# Patient Record
Sex: Male | Born: 1985 | Race: White | Hispanic: No | Marital: Single | Smoking: Former smoker
Health system: Southern US, Community
[De-identification: ages and names within clinical notes are randomized; demographics above are authoritative.]

## PROBLEM LIST (undated history)

## (undated) DIAGNOSIS — Z789 Other specified health status: Secondary | ICD-10-CM

---
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
## 2020-07-01 ENCOUNTER — Inpatient Hospital Stay (HOSPITAL_COMMUNITY): Payer: BC Managed Care – PPO

## 2020-07-01 ENCOUNTER — Encounter (HOSPITAL_COMMUNITY): Payer: Self-pay | Admitting: Family Medicine

## 2020-07-01 ENCOUNTER — Other Ambulatory Visit: Payer: Self-pay

## 2020-07-01 ENCOUNTER — Inpatient Hospital Stay (HOSPITAL_COMMUNITY)
Admission: EM | Admit: 2020-07-01 | Discharge: 2020-07-05 | DRG: 871 | Disposition: A | Discharge status: Home / Self Care | Payer: BC Managed Care – PPO | Attending: Internal Medicine | Admitting: Internal Medicine

## 2020-07-01 ENCOUNTER — Emergency Department (HOSPITAL_COMMUNITY): Payer: BC Managed Care – PPO

## 2020-07-01 DIAGNOSIS — J129 Viral pneumonia, unspecified: Secondary | ICD-10-CM | POA: Diagnosis not present

## 2020-07-01 DIAGNOSIS — J9601 Acute respiratory failure with hypoxia: Secondary | ICD-10-CM | POA: Diagnosis present

## 2020-07-01 DIAGNOSIS — R809 Proteinuria, unspecified: Secondary | ICD-10-CM | POA: Diagnosis present

## 2020-07-01 DIAGNOSIS — R071 Chest pain on breathing: Secondary | ICD-10-CM

## 2020-07-01 DIAGNOSIS — R000 Tachycardia, unspecified: Secondary | ICD-10-CM | POA: Diagnosis present

## 2020-07-01 DIAGNOSIS — Z87891 Personal history of nicotine dependence: Secondary | ICD-10-CM | POA: Diagnosis not present

## 2020-07-01 DIAGNOSIS — J1282 Pneumonia due to coronavirus disease 2019: Secondary | ICD-10-CM | POA: Diagnosis present

## 2020-07-01 DIAGNOSIS — R079 Chest pain, unspecified: Secondary | ICD-10-CM | POA: Diagnosis present

## 2020-07-01 DIAGNOSIS — A4189 Other specified sepsis: Secondary | ICD-10-CM | POA: Diagnosis present

## 2020-07-01 DIAGNOSIS — J189 Pneumonia, unspecified organism: Secondary | ICD-10-CM | POA: Diagnosis present

## 2020-07-01 DIAGNOSIS — A419 Sepsis, unspecified organism: Secondary | ICD-10-CM | POA: Diagnosis present

## 2020-07-01 DIAGNOSIS — Z9981 Dependence on supplemental oxygen: Secondary | ICD-10-CM | POA: Diagnosis not present

## 2020-07-01 DIAGNOSIS — U071 COVID-19: Secondary | ICD-10-CM | POA: Diagnosis present

## 2020-07-01 DIAGNOSIS — R55 Syncope and collapse: Secondary | ICD-10-CM | POA: Diagnosis present

## 2020-07-01 DIAGNOSIS — R824 Acetonuria: Secondary | ICD-10-CM | POA: Diagnosis present

## 2020-07-01 DIAGNOSIS — R0602 Shortness of breath: Secondary | ICD-10-CM

## 2020-07-01 HISTORY — DX: Other specified health status: Z78.9

## 2020-07-01 LAB — COMPREHENSIVE METABOLIC PANEL
ALT: 41 U/L (ref 0–44)
AST: 36 U/L (ref 15–41)
Albumin: 3.7 g/dL (ref 3.5–5.0)
Alkaline Phosphatase: 68 U/L (ref 38–126)
Anion gap: 13 (ref 5–15)
BUN: 9 mg/dL (ref 6–20)
CO2: 25 mmol/L (ref 22–32)
Calcium: 8.8 mg/dL — ABNORMAL LOW (ref 8.9–10.3)
Chloride: 98 mmol/L (ref 98–111)
Creatinine, Ser: 1.17 mg/dL (ref 0.61–1.24)
GFR calc Af Amer: >60 mL/min (ref >60)
GFR calc non Af Amer: >60 mL/min (ref >60)
Glucose, Bld: 112 mg/dL — ABNORMAL HIGH (ref 70–99)
Potassium: 3.9 mmol/L (ref 3.5–5.1)
Sodium: 136 mmol/L (ref 135–145)
Total Bilirubin: 1.3 mg/dL — ABNORMAL HIGH (ref 0.3–1.2)
Total Protein: 7 g/dL (ref 6.5–8.1)

## 2020-07-01 LAB — CBC WITH DIFFERENTIAL/PLATELET
Abs Immature Granulocytes: 0.03 10*3/uL (ref 0.00–0.07)
Basophils Absolute: 0 10*3/uL (ref 0.0–0.1)
Basophils Relative: 0 %
Eosinophils Absolute: 0 10*3/uL (ref 0.0–0.5)
Eosinophils Relative: 0 %
HCT: 43.6 % (ref 39.0–52.0)
Hemoglobin: 14.5 g/dL (ref 13.0–17.0)
Immature Granulocytes: 1 %
Lymphocytes Relative: 23 %
Lymphs Abs: 1.1 10*3/uL (ref 0.7–4.0)
MCH: 29.1 pg (ref 26.0–34.0)
MCHC: 33.3 g/dL (ref 30.0–36.0)
MCV: 87.6 fL (ref 80.0–100.0)
Monocytes Absolute: 0.5 10*3/uL (ref 0.1–1.0)
Monocytes Relative: 10 %
Neutro Abs: 3.2 10*3/uL (ref 1.7–7.7)
Neutrophils Relative %: 66 %
Platelets: 190 10*3/uL (ref 150–400)
RBC: 4.98 MIL/uL (ref 4.22–5.81)
RDW: 11.4 % — ABNORMAL LOW (ref 11.5–15.5)
WBC: 4.8 10*3/uL (ref 4.0–10.5)
nRBC: 0 % (ref 0.0–0.2)

## 2020-07-01 LAB — URINALYSIS, ROUTINE W REFLEX MICROSCOPIC
Bacteria, UA: NONE SEEN
Bilirubin Urine: NEGATIVE
Glucose, UA: NEGATIVE mg/dL
Hgb urine dipstick: NEGATIVE
Ketones, ur: 80 mg/dL — AB
Leukocytes,Ua: NEGATIVE
Nitrite: NEGATIVE
Protein, ur: 30 mg/dL — AB
Specific Gravity, Urine: 1.025 (ref 1.005–1.030)
pH: 5 (ref 5.0–8.0)

## 2020-07-01 LAB — TROPONIN I (HIGH SENSITIVITY)
Troponin I (High Sensitivity): 4 ng/L (ref <18)
Troponin I (High Sensitivity): 5 ng/L (ref <18)

## 2020-07-01 LAB — BRAIN NATRIURETIC PEPTIDE: B Natriuretic Peptide: 14.3 pg/mL (ref 0.0–100.0)

## 2020-07-01 LAB — PROCALCITONIN: Procalcitonin: <0.1 ng/mL

## 2020-07-01 LAB — LACTIC ACID, PLASMA
Lactic Acid, Venous: 1.4 mmol/L (ref 0.5–1.9)
Lactic Acid, Venous: 1.4 mmol/L (ref 0.5–1.9)

## 2020-07-01 LAB — LACTATE DEHYDROGENASE: LDH: 273 U/L — ABNORMAL HIGH (ref 98–192)

## 2020-07-01 LAB — D-DIMER, QUANTITATIVE: D-Dimer, Quant: 0.63 ug/mL-FEU — ABNORMAL HIGH (ref 0.00–0.50)

## 2020-07-01 LAB — ABO/RH: ABO/RH(D): O POS

## 2020-07-01 LAB — FIBRINOGEN: Fibrinogen: 554 mg/dL — ABNORMAL HIGH (ref 210–475)

## 2020-07-01 LAB — PROTIME-INR
INR: 1 (ref 0.8–1.2)
Prothrombin Time: 12.4 seconds (ref 11.4–15.2)

## 2020-07-01 LAB — C-REACTIVE PROTEIN: CRP: 5.7 mg/dL — ABNORMAL HIGH (ref <1.0)

## 2020-07-01 LAB — SARS CORONAVIRUS 2 BY RT PCR (HOSPITAL ORDER, PERFORMED IN ~~LOC~~ HOSPITAL LAB): SARS Coronavirus 2: POSITIVE — AB

## 2020-07-01 LAB — HIV ANTIBODY (ROUTINE TESTING W REFLEX): HIV Screen 4th Generation wRfx: NONREACTIVE

## 2020-07-01 MED ORDER — DEXAMETHASONE SODIUM PHOSPHATE 10 MG/ML IJ SOLN
6.0000 mg | INTRAMUSCULAR | Status: DC
  Administered 2020-07-01: 6 mg via INTRAVENOUS
  Filled 2020-07-01: qty 1

## 2020-07-01 MED ORDER — GUAIFENESIN-DM 100-10 MG/5ML PO SYRP: 10.0000 mL | ORAL_SOLUTION | ORAL | Status: DC | PRN

## 2020-07-01 MED ORDER — SODIUM CHLORIDE 0.9 % IV SOLN: 100.0000 mg | Freq: Every day | INTRAVENOUS | Status: DC

## 2020-07-01 MED ORDER — SODIUM CHLORIDE 0.9 % IV SOLN
200.0000 mg | Freq: Once | INTRAVENOUS | Status: AC
  Administered 2020-07-02: 200 mg via INTRAVENOUS
  Filled 2020-07-01: qty 40

## 2020-07-01 MED ORDER — LACTATED RINGERS IV BOLUS
1000.0000 mL | Freq: Once | INTRAVENOUS | Status: AC
  Administered 2020-07-01: 1000 mL via INTRAVENOUS

## 2020-07-01 MED ORDER — ACETAMINOPHEN 325 MG PO TABS
650.0000 mg | ORAL_TABLET | Freq: Once | ORAL | Status: AC | PRN
  Administered 2020-07-01: 650 mg via ORAL
  Filled 2020-07-01: qty 2

## 2020-07-01 MED ORDER — IOHEXOL 350 MG/ML SOLN
75.0000 mL | Freq: Once | INTRAVENOUS | Status: AC | PRN
  Administered 2020-07-01: 75 mL via INTRAVENOUS

## 2020-07-01 NOTE — ED Triage Notes (Signed)
Pt reports feeling light headed while he was showering today. Also endorses difficulty breathing and chest pain. Dx with covid last week.

## 2020-07-01 NOTE — ED Notes (Signed)
Date and time results received: 07/01/20 2305  (use smartphrase ".now" to insert current time)  Test: SARS CORONAVIRUS Critical Value: positive   Name of Provider Notified: provider aware   Orders Received? Or Actions Taken?:

## 2020-07-01 NOTE — ED Notes (Signed)
Pt O2sat was 92% on room air upon arriving to room. This NT placed pt on 2L oxygen via nasal cannula

## 2020-07-01 NOTE — H&P (Signed)
History and Physical    Marvin Brady EHO:[id number] DOB: 09-04-1986 DOA: 07/01/2020  PCP: Patient, No Pcp Per   Patient coming from: Home   Chief Complaint: SOB, fevers, cough, chest pain, lightheadedness   HPI: Marvin Brady is a 34 y.o. male he denies any significant past medical history now presents to the emergency department with fevers, cough, shortness of breath, lightheadedness, and chest pain.  Patient reports that he been in his usual state of health until 8 days ago when he developed fevers, sinus congestion, general malaise, and cough.  He notes that there have been multiple sick contacts reported to have Covid.  He has had progressive shortness of breath, cough, ongoing fevers, and no pleuritic chest pain.  He became lightheaded today without any loss of consciousness.  He reports testing positive for COVID-19 during this illness.  ED Course: Upon arrival to the ED, patient is found to be febrile to 39.4 C, saturating low 90s on room air while at rest, slightly tachypneic, tachycardic to 120, and with stable blood pressure.  Chemistry panel is notable for slight elevation in bilirubin.  CBC is unremarkable.  Lactic acid is reassuringly normal and troponin is also normal.  Urinalysis with ketonuria and proteinuria.  Chest x-ray with bibasilar hazy opacities consistent with viral pneumonia.  EKG features sinus tachycardia with rate 124.  Blood cultures were collected and the patient was given a liter of lactated Ringer's and acetaminophen in the ED.  He was started on supplemental oxygen.  Review of Systems:  All other systems reviewed and apart from HPI, are negative.  Past Medical History:  Diagnosis Date  . Medical history non-contributory     History reviewed. No pertinent surgical history.  Social History:   reports that he has quit smoking. He has never used smokeless tobacco. He reports previous alcohol use. No history on file for drug use.  No Known  Allergies  Family History  Problem Relation Age of Onset  . Pneumonia Other      Prior to Admission medications   Not on File    Physical Exam: Vitals:   07/01/20 1503 07/01/20 1734 07/01/20 1843 07/01/20 1952  BP: 113/78 102/72 121/82 121/77  Pulse: 100 (!) 108 94 (!) 117  Resp: 17 14 18  (!) 28  Temp: (!) 102.9 F (39.4 C)  99.6 F (37.6 C)   TempSrc: Oral  Oral   SpO2: 93% 92% 94% 92%    Constitutional: NAD, calm  Eyes: PERTLA, lids and conjunctivae normal ENMT: Mucous membranes are moist. Posterior pharynx clear of any exudate or lesions.   Neck: normal, supple, no masses, no thyromegaly Respiratory: Mild tachypnea, frequent cough. No pallor or cyanosis.  Cardiovascular: S1 & S2 heard, regular rate and rhythm. No extremity edema.   Abdomen: No distension, no tenderness, soft. Bowel sounds active.  Musculoskeletal: no clubbing / cyanosis. No joint deformity upper and lower extremities.   Skin: no significant rashes, lesions, ulcers. Warm, dry, well-perfused. Neurologic: CN 2-12 grossly intact. Sensation intact. Moving all extremities.  Psychiatric: Alert and oriented to person, place, and situation. Pleasant and cooperative.    Labs and Imaging on Admission: I have personally reviewed following labs and imaging studies  CBC: Recent Labs  Lab 07/01/20 1306  WBC 4.8  NEUTROABS 3.2  HGB 14.5  HCT 43.6  MCV 87.6  PLT 190   Basic Metabolic Panel: Recent Labs  Lab 07/01/20 1306  NA 136  K 3.9  CL 98  CO2 25  GLUCOSE 112*  BUN 9  CREATININE 1.17  CALCIUM 8.8*   GFR: CrCl cannot be calculated (Unknown ideal weight.). Liver Function Tests: Recent Labs  Lab 07/01/20 1306  AST 36  ALT 41  ALKPHOS 68  BILITOT 1.3*  PROT 7.0  ALBUMIN 3.7   No results for input(s): LIPASE, AMYLASE in the last 168 hours. No results for input(s): AMMONIA in the last 168 hours. Coagulation Profile: Recent Labs  Lab 07/01/20 1306  INR 1.0   Cardiac Enzymes: No  results for input(s): CKTOTAL, CKMB, CKMBINDEX, TROPONINI in the last 168 hours. BNP (last 3 results) No results for input(s): PROBNP in the last 8760 hours. HbA1C: No results for input(s): HGBA1C in the last 72 hours. CBG: No results for input(s): GLUCAP in the last 168 hours. Lipid Profile: No results for input(s): CHOL, HDL, LDLCALC, TRIG, CHOLHDL, LDLDIRECT in the last 72 hours. Thyroid Function Tests: No results for input(s): TSH, T4TOTAL, FREET4, T3FREE, THYROIDAB in the last 72 hours. Anemia Panel: No results for input(s): VITAMINB12, FOLATE, FERRITIN, TIBC, IRON, RETICCTPCT in the last 72 hours. Urine analysis:    Component Value Date/Time   COLORURINE AMBER (A) 07/01/2020 1943   APPEARANCEUR HAZY (A) 07/01/2020 1943   LABSPEC 1.025 07/01/2020 1943   PHURINE 5.0 07/01/2020 1943   GLUCOSEU NEGATIVE 07/01/2020 1943   HGBUR NEGATIVE 07/01/2020 1943   BILIRUBINUR NEGATIVE 07/01/2020 1943   KETONESUR 80 (A) 07/01/2020 1943   PROTEINUR 30 (A) 07/01/2020 1943   NITRITE NEGATIVE 07/01/2020 1943   LEUKOCYTESUR NEGATIVE 07/01/2020 1943   Sepsis Labs: @LABRCNTIP (procalcitonin:4,lacticidven:4) )No results found for this or any previous visit (from the past 240 hour(s)).   Radiological Exams on Admission: DG Chest Portable 1 View  Result Date: 07/01/2020 CLINICAL DATA:  Short of breath EXAM: PORTABLE CHEST 1 VIEW COMPARISON:  March 09, 2006 FINDINGS: The cardiomediastinal silhouette is normal in contour. No pleural effusion. No pneumothorax. Bibasilar peripheral predominant hazy opacities. Visualized abdomen is unremarkable. No acute osseous abnormality noted. IMPRESSION: Bibasilar peripheral predominant hazy opacities are suspicious for COVID-19 pneumonia. Electronically Signed   By: March 11, 2006 MD   On: 07/01/2020 13:14    EKG: Independently reviewed. Sinus tachycardia (rate 124)   Assessment/Plan   1. Sepsis secondary to pneumonia  - Presents with 8 days of fevers,  progressive SOB and cough, and now pleuritic chest pain and lightheadedness, reports being positive for COVID-19 but unable to confirm in ED and COVID pcr pending  - He is found to be febrile and tachycardic with viral pneumonia type findings on CXR  - Blood cultures were collected in ED and he was treated with IVF hydration and supplemental O2  - Check COVID pcr, start Decadron now and remdesivir once COVID confirmed, check procalcitonin and consider antibiotics if elevated, continue supplemental O2 as needed, prone as tolerated, trend inflammatory markers, follow cultures and clninical course    2. Chest pain  - Patient reports pleuritic chest pain in setting of likely COVID pneumonia  - Troponin is normal and ACS highly unlikely   - Check CTA chest, continue pneumonia treatment as above    DVT prophylaxis: Lovenox  Code Status: Full  Family Communication: Discussed with patient  Disposition Plan:  Patient is from: Home  Anticipated d/c is to: Home  Anticipated d/c date is: 07/04/20 Patient currently: Dyspneic at rest, pending CTA chest  Consults called: None Admission status: Inpatient     07/06/20, MD [facility]  07/01/2020, 9:06 PM

## 2020-07-01 NOTE — ED Notes (Signed)
Pt given urinal in room. This NT told him to give urinal as soon as possible for UA

## 2020-07-01 NOTE — ED Provider Notes (Signed)
[facility] EMERGENCY DEPARTMENT Provider Note   CSN: [id number] Arrival date & time: 07/01/20  1238     History Chief Complaint  Patient presents with  . Near Syncope    Marvin Brady is a 34 y.o. male who presents with SOB and pre-syncopal episode this am. Pt diagnosed with COVID last week, symptoms started on 7/18 (8 days prior). Pt endorses SOB w some chest discomfort with deep inspiration that "gives him the sensation he needs to cough". Denies chest pain with laying flat, no hx of DVT/PE. Pt states girlfriends family was sick with COVID. Endorses cough, SOB, lost of taste/smell, nausea, emesis and decreased PO. Pt states this am he was in the shower when he began to feel lightheaded with standing associated with some blackness of vision, nausea and sensation he was going to pass out. Pre-syncopal symptoms now resolved with laying down in room.   HPI     Past Medical History:  Diagnosis Date  . Medical history non-contributory     Patient Active Problem List   Diagnosis Date Noted  . Viral pneumonia 07/01/2020  . Chest pain 07/01/2020  . Sepsis due to pneumonia (HCC) 07/01/2020    History reviewed. No pertinent surgical history.     Family History  Problem Relation Age of Onset  . Pneumonia Other     Social History   Tobacco Use  . Smoking status: Former Games developer  . Smokeless tobacco: Never Used  Substance Use Topics  . Alcohol use: Not Currently  . Drug use: Not on file    Home Medications Prior to Admission medications   Not on File    Allergies    Patient has no known allergies.  Review of Systems   Review of Systems  Constitutional: Positive for activity change, appetite change, chills, fatigue and fever.  HENT: Negative for congestion and sore throat.   Eyes: Positive for visual disturbance. Negative for redness.       Endorses changes to vision during episode of pre-syncope that has now resolved.    Respiratory: Positive  for shortness of breath. Negative for wheezing.   Cardiovascular: Negative for chest pain and leg swelling.  Gastrointestinal: Positive for nausea and vomiting. Negative for abdominal pain.  Genitourinary: Positive for decreased urine volume. Negative for difficulty urinating and dysuria.  Skin: Negative for rash and wound.  Neurological: Negative for syncope and facial asymmetry.  Psychiatric/Behavioral: Negative.     Physical Exam Updated Vital Signs BP 118/71   Pulse 93   Temp 99.6 F (37.6 C) (Oral)   Resp 18   SpO2 96%   Vitals:   07/01/20 1843 07/01/20 1952 07/01/20 2100 07/01/20 2200  BP: 121/82 121/77 127/75 118/71  Pulse: 94 (!) 117 93 93  Resp: 18 20 17 18   Temp: 99.6 F (37.6 C)     TempSrc: Oral     SpO2: 94% 92% 97% 96%    Physical Exam Vitals and nursing note reviewed.  Constitutional:      General: He is not in acute distress.    Appearance: Normal appearance. He is not toxic-appearing or diaphoretic.  HENT:     Head: Normocephalic and atraumatic.     Nose: Nose normal.     Mouth/Throat:     Mouth: Mucous membranes are dry.  Eyes:     Extraocular Movements: Extraocular movements intact.     Conjunctiva/sclera: Conjunctivae normal.  Cardiovascular:     Rate and Rhythm: Normal rate and regular rhythm.  Heart sounds: Normal heart sounds. No murmur heard.   Pulmonary:     Effort: Respiratory distress present.     Breath sounds: Normal breath sounds. No wheezing, rhonchi or rales.  Abdominal:     General: Abdomen is flat.     Palpations: Abdomen is soft.     Tenderness: There is no abdominal tenderness.  Musculoskeletal:     Right lower leg: No edema.     Left lower leg: No edema.  Skin:    General: Skin is warm.     Findings: No rash.  Neurological:     General: No focal deficit present.     Mental Status: He is alert and oriented to person, place, and time.  Psychiatric:        Mood and Affect: Mood normal.        Behavior: Behavior  normal.        Thought Content: Thought content normal.        Judgment: Judgment normal.     ED Results / Procedures / Treatments   Labs (all labs ordered are listed, but only abnormal results are displayed) Labs Reviewed  SARS CORONAVIRUS 2 BY RT PCR (HOSPITAL ORDER, PERFORMED IN [facility]) - Abnormal; Notable for the following components:      Result Value   SARS Coronavirus 2 POSITIVE (*)    All other components within normal limits  COMPREHENSIVE METABOLIC PANEL - Abnormal; Notable for the following components:   Glucose, Bld 112 (*)    Calcium 8.8 (*)    Total Bilirubin 1.3 (*)    All other components within normal limits  CBC WITH DIFFERENTIAL/PLATELET - Abnormal; Notable for the following components:   RDW 11.4 (*)    All other components within normal limits  URINALYSIS, ROUTINE W REFLEX MICROSCOPIC - Abnormal; Notable for the following components:   Color, Urine AMBER (*)    APPearance HAZY (*)    Ketones, ur 80 (*)    Protein, ur 30 (*)    All other components within normal limits  C-REACTIVE PROTEIN - Abnormal; Notable for the following components:   CRP 5.7 (*)    All other components within normal limits  D-DIMER, QUANTITATIVE (NOT AT [facility]) - Abnormal; Notable for the following components:   D-Dimer, Quant 0.63 (*)    All other components within normal limits  LACTATE DEHYDROGENASE - Abnormal; Notable for the following components:   LDH 273 (*)    All other components within normal limits  FIBRINOGEN - Abnormal; Notable for the following components:   Fibrinogen 554 (*)    All other components within normal limits  CULTURE, BLOOD (ROUTINE X 2)  CULTURE, BLOOD (ROUTINE X 2)  LACTIC ACID, PLASMA  LACTIC ACID, PLASMA  PROTIME-INR  HIV ANTIBODY (ROUTINE TESTING W REFLEX)  BRAIN NATRIURETIC PEPTIDE  PROCALCITONIN  HEPATITIS B SURFACE ANTIGEN  ABO/RH  TROPONIN I (HIGH SENSITIVITY)  TROPONIN I (HIGH SENSITIVITY)    EKG EKG  Interpretation  Date/Time:  Monday July 01 2020 12:56:51 EDT Ventricular Rate:  124 PR Interval:  148 QRS Duration: 94 QT Interval:  308 QTC Calculation: 442 R Axis:   107 Text Interpretation: Sinus tachycardia Rightward axis Cannot rule out Inferior infarct , age undetermined Abnormal ECG No old tracing to compare Confirmed by Melene PlanFloyd, Dan [phone]([id number]) on 07/01/2020 6:53:16 PM   Radiology CT ANGIO CHEST PE W OR WO CONTRAST  Result Date: 07/01/2020 CLINICAL DATA:  Chest pain and shortness of breath COVID EXAM:  CT ANGIOGRAPHY CHEST WITH CONTRAST TECHNIQUE: Multidetector CT imaging of the chest was performed using the standard protocol during bolus administration of intravenous contrast. Multiplanar CT image reconstructions and MIPs were obtained to evaluate the vascular anatomy. CONTRAST:  66mL OMNIPAQUE IOHEXOL 350 MG/ML SOLN COMPARISON:  None. FINDINGS: Cardiovascular: There is slightly suboptimal opacification of the main pulmonary artery. No central or segmental pulmonary embolism is seen. Limited evaluation of the subsegmental arterial branches. The heart is normal in size. No pericardial effusion or thickening. No evidence right heart strain. There is normal three-vessel brachiocephalic anatomy without proximal stenosis. The thoracic aorta is normal in appearance. Mediastinum/Nodes: No hilar, mediastinal, or axillary adenopathy. Thyroid gland, trachea, and esophagus demonstrate no significant findings. Lungs/Pleura: Patchy rounded areas consolidation are seen at the posterior bilateral lung bases. There air bronchogram seen at the right lung base. No effusion is seen. Upper Abdomen: No acute abnormalities present in the visualized portions of the upper abdomen. Musculoskeletal: No chest wall abnormality. No acute or significant osseous findings. Review of the MIP images confirms the above findings. IMPRESSION: Slightly suboptimal opacification of the main pulmonary artery, no central or segmental  pulmonary embolism. Multifocal airspace opacities in both lower lungs, consistent with multifocal pneumonia. Electronically Signed   By: Jonna Clark M.D.   On: 07/01/2020 23:06   DG Chest Portable 1 View  Result Date: 07/01/2020 CLINICAL DATA:  Short of breath EXAM: PORTABLE CHEST 1 VIEW COMPARISON:  March 09, 2006 FINDINGS: The cardiomediastinal silhouette is normal in contour. No pleural effusion. No pneumothorax. Bibasilar peripheral predominant hazy opacities. Visualized abdomen is unremarkable. No acute osseous abnormality noted. IMPRESSION: Bibasilar peripheral predominant hazy opacities are suspicious for COVID-19 pneumonia. Electronically Signed   By: Meda Klinefelter MD   On: 07/01/2020 13:14    Procedures Procedures (including critical care time)  Medications Ordered in ED Medications  dexamethasone (DECADRON) injection 6 mg (6 mg Intravenous Given 07/01/20 2128)  guaiFENesin-dextromethorphan (ROBITUSSIN DM) 100-10 MG/5ML syrup 10 mL (has no administration in time range)  remdesivir 200 mg in sodium chloride 0.9% 250 mL IVPB (has no administration in time range)    Followed by  remdesivir 100 mg in sodium chloride 0.9 % 100 mL IVPB (has no administration in time range)  acetaminophen (TYLENOL) tablet 650 mg (650 mg Oral Given 07/01/20 1649)  lactated ringers bolus 1,000 mL (0 mLs Intravenous Stopped 07/01/20 2302)  iohexol (OMNIPAQUE) 350 MG/ML injection 75 mL (75 mLs Intravenous Contrast Given 07/01/20 2300)    ED Course  I have reviewed the triage vital signs and the nursing notes.  Pertinent labs & imaging results that were available during my care of the patient were reviewed by me and considered in my medical decision making (see chart for details).    MDM Rules/Calculators/A&P                          Pt is an otherwise healthy 33 yo M who presents with increased SOB and pre-syncopal episode this am. Pt diagnosed with COVID last week, on ~ day 8 of symptoms. Pt endorses  SOB wo Chest pain, Fever, Nausea/emesis, lost of taste/smell. Pre-syncopal episode occurred in shower this am wo LOC, associated with vision changes, nausea. Pt endorses decreased PO over past few days. On arrival pt febrile to 102.7 and tachycardic to HR 120s and SpO2 90% rm air.  Patient given Tylenol with appropriate response, and improvement in temperature and tachycardia.  On exam patient with dry  mm. Lungs clear to auscultation. Abdomen soft, non-tender, non-distended. No LE edema b/l. Pt placed on 2L [place] with improvement O2 to 96%. CXR significant for bibasilar opacities consistent with COVID19 infxn. Labs fully reviewed. No lactic acidosis. On CMP Cr 1.17 however prior labs not available for comparison but could be concerning for AKI in setting of decreased PO. 1L LR given. WBC wnl. Initial troponin 4. On ambulation pt w O2 drop to 89% on rm air. Due to new O2 requirement and pre-syncopal event in the setting of decreased PO will admit to hospitalist service for continued management. Patient admitted.  Final Clinical Impression(s) / ED Diagnoses Final diagnoses:  Near syncope  SOB (shortness of breath)  COVID-19    Rx / DC Orders ED Discharge Orders    None       Golden Pop, MD 07/01/20 2345    Melene Plan, DO 07/02/20 1335

## 2020-07-02 DIAGNOSIS — J129 Viral pneumonia, unspecified: Secondary | ICD-10-CM

## 2020-07-02 DIAGNOSIS — J189 Pneumonia, unspecified organism: Secondary | ICD-10-CM

## 2020-07-02 DIAGNOSIS — A419 Sepsis, unspecified organism: Secondary | ICD-10-CM

## 2020-07-02 LAB — BLOOD CULTURE ID PANEL (REFLEXED)

## 2020-07-02 LAB — CBC WITH DIFFERENTIAL/PLATELET
Abs Immature Granulocytes: 0 10*3/uL (ref 0.00–0.07)
Basophils Absolute: 0 10*3/uL (ref 0.0–0.1)
Basophils Relative: 0 %
Eosinophils Absolute: 0 10*3/uL (ref 0.0–0.5)
Eosinophils Relative: 0 %
HCT: 37.1 % — ABNORMAL LOW (ref 39.0–52.0)
Hemoglobin: 12.8 g/dL — ABNORMAL LOW (ref 13.0–17.0)
Lymphocytes Relative: 15 %
Lymphs Abs: 0.5 10*3/uL — ABNORMAL LOW (ref 0.7–4.0)
MCH: 29.4 pg (ref 26.0–34.0)
MCHC: 34.5 g/dL (ref 30.0–36.0)
MCV: 85.3 fL (ref 80.0–100.0)
Monocytes Absolute: 0.2 10*3/uL (ref 0.1–1.0)
Monocytes Relative: 6 %
Neutro Abs: 2.8 10*3/uL (ref 1.7–7.7)
Neutrophils Relative %: 79 %
Platelets: 183 10*3/uL (ref 150–400)
RBC: 4.35 MIL/uL (ref 4.22–5.81)
RDW: 11.3 % — ABNORMAL LOW (ref 11.5–15.5)
WBC: 3.6 10*3/uL — ABNORMAL LOW (ref 4.0–10.5)
nRBC: 0 % (ref 0.0–0.2)
nRBC: 0 /100 WBC

## 2020-07-02 LAB — COMPREHENSIVE METABOLIC PANEL
ALT: 96 U/L — ABNORMAL HIGH (ref 0–44)
AST: 87 U/L — ABNORMAL HIGH (ref 15–41)
Albumin: 3.3 g/dL — ABNORMAL LOW (ref 3.5–5.0)
Alkaline Phosphatase: 66 U/L (ref 38–126)
Anion gap: 9 (ref 5–15)
BUN: 11 mg/dL (ref 6–20)
CO2: 23 mmol/L (ref 22–32)
Calcium: 8.7 mg/dL — ABNORMAL LOW (ref 8.9–10.3)
Chloride: 104 mmol/L (ref 98–111)
Creatinine, Ser: 0.91 mg/dL (ref 0.61–1.24)
GFR calc Af Amer: >60 mL/min (ref >60)
GFR calc non Af Amer: >60 mL/min (ref >60)
Glucose, Bld: 169 mg/dL — ABNORMAL HIGH (ref 70–99)
Potassium: 4.2 mmol/L (ref 3.5–5.1)
Sodium: 136 mmol/L (ref 135–145)
Total Bilirubin: 1 mg/dL (ref 0.3–1.2)
Total Protein: 6.6 g/dL (ref 6.5–8.1)

## 2020-07-02 LAB — HEPATITIS B SURFACE ANTIGEN: Hepatitis B Surface Ag: NONREACTIVE

## 2020-07-02 LAB — C-REACTIVE PROTEIN: CRP: 7.1 mg/dL — ABNORMAL HIGH (ref <1.0)

## 2020-07-02 LAB — D-DIMER, QUANTITATIVE: D-Dimer, Quant: 0.61 ug/mL-FEU — ABNORMAL HIGH (ref 0.00–0.50)

## 2020-07-02 LAB — FERRITIN: Ferritin: 508 ng/mL — ABNORMAL HIGH (ref 24–336)

## 2020-07-02 LAB — MAGNESIUM: Magnesium: 1.9 mg/dL (ref 1.7–2.4)

## 2020-07-02 MED ORDER — SODIUM CHLORIDE 0.9% FLUSH: 3.0000 mL | INTRAVENOUS | Status: DC | PRN

## 2020-07-02 MED ORDER — SODIUM CHLORIDE 0.9 % IV SOLN
100.0000 mg | Freq: Every day | INTRAVENOUS | Status: AC
  Administered 2020-07-02 – 2020-07-05 (×4): 100 mg via INTRAVENOUS
  Filled 2020-07-02 (×4): qty 20

## 2020-07-02 MED ORDER — SODIUM CHLORIDE 0.9% FLUSH
3.0000 mL | Freq: Two times a day (BID) | INTRAVENOUS | Status: DC
  Administered 2020-07-02 – 2020-07-04 (×2): 3 mL via INTRAVENOUS

## 2020-07-02 MED ORDER — ONDANSETRON HCL 4 MG PO TABS: 4.0000 mg | ORAL_TABLET | Freq: Four times a day (QID) | ORAL | Status: DC | PRN

## 2020-07-02 MED ORDER — ORAL CARE MOUTH RINSE
15.0000 mL | Freq: Two times a day (BID) | OROMUCOSAL | Status: DC
  Administered 2020-07-02 – 2020-07-05 (×5): 15 mL via OROMUCOSAL

## 2020-07-02 MED ORDER — ACETAMINOPHEN 325 MG PO TABS: 650.0000 mg | ORAL_TABLET | Freq: Four times a day (QID) | ORAL | Status: DC | PRN

## 2020-07-02 MED ORDER — SODIUM CHLORIDE 0.9 % IV SOLN: 250.0000 mL | INTRAVENOUS | Status: DC | PRN

## 2020-07-02 MED ORDER — ONDANSETRON HCL 4 MG/2ML IJ SOLN: 4.0000 mg | Freq: Four times a day (QID) | INTRAMUSCULAR | Status: DC | PRN

## 2020-07-02 MED ORDER — SODIUM CHLORIDE 0.9% FLUSH
3.0000 mL | Freq: Two times a day (BID) | INTRAVENOUS | Status: DC
  Administered 2020-07-02 – 2020-07-04 (×6): 3 mL via INTRAVENOUS

## 2020-07-02 MED ORDER — METHYLPREDNISOLONE SODIUM SUCC 40 MG IJ SOLR
40.0000 mg | Freq: Two times a day (BID) | INTRAMUSCULAR | Status: DC
  Administered 2020-07-02 – 2020-07-05 (×7): 40 mg via INTRAVENOUS
  Filled 2020-07-02 (×7): qty 1

## 2020-07-02 MED ORDER — ENOXAPARIN SODIUM 40 MG/0.4ML ~~LOC~~ SOLN
40.0000 mg | SUBCUTANEOUS | Status: DC
  Administered 2020-07-02 – 2020-07-05 (×4): 40 mg via SUBCUTANEOUS
  Filled 2020-07-02 (×4): qty 0.4

## 2020-07-02 MED ORDER — HYDROCODONE-ACETAMINOPHEN 5-325 MG PO TABS: 1.0000 | ORAL_TABLET | ORAL | Status: DC | PRN

## 2020-07-02 NOTE — Progress Notes (Signed)
PHARMACY - PHYSICIAN COMMUNICATION CRITICAL VALUE ALERT - BLOOD CULTURE IDENTIFICATION (BCID)  Marvin Brady is an 34 y.o. male who presented to [facility] on 07/01/2020 with a chief complaint of shortness of breath, cough and chest pain.  Assessment: COVID positive patient with 1/4 bottles resulting for likely CoNS. Probable contamination given no indwelling hardware or lines.  Name of physician (or Provider) Contacted: Shanker Ghimire  Current antibiotics: none  Changes to prescribed antibiotics recommended:  Withhold antibiotics at this time given high probability of contamination Recommendations accepted by provider  Results for orders placed or performed during the hospital encounter of 07/01/20  Blood Culture ID Panel (Reflexed) (Collected: 07/01/2020  7:42 PM)  Result Value Ref Range   Enterococcus species NOT DETECTED NOT DETECTED   Listeria monocytogenes NOT DETECTED NOT DETECTED   Staphylococcus species DETECTED (A) NOT DETECTED   Staphylococcus aureus (BCID) NOT DETECTED NOT DETECTED   Methicillin resistance NOT DETECTED NOT DETECTED   Streptococcus species NOT DETECTED NOT DETECTED   Streptococcus agalactiae NOT DETECTED NOT DETECTED   Streptococcus pneumoniae NOT DETECTED NOT DETECTED   Streptococcus pyogenes NOT DETECTED NOT DETECTED   Acinetobacter baumannii NOT DETECTED NOT DETECTED   Enterobacteriaceae species NOT DETECTED NOT DETECTED   Enterobacter cloacae complex NOT DETECTED NOT DETECTED   Escherichia coli NOT DETECTED NOT DETECTED   Klebsiella oxytoca NOT DETECTED NOT DETECTED   Klebsiella pneumoniae NOT DETECTED NOT DETECTED   Proteus species NOT DETECTED NOT DETECTED   Serratia marcescens NOT DETECTED NOT DETECTED   Haemophilus influenzae NOT DETECTED NOT DETECTED   Neisseria meningitidis NOT DETECTED NOT DETECTED   Pseudomonas aeruginosa NOT DETECTED NOT DETECTED   Candida albicans NOT DETECTED NOT DETECTED   Candida glabrata NOT DETECTED NOT DETECTED    Candida krusei NOT DETECTED NOT DETECTED   Candida parapsilosis NOT DETECTED NOT DETECTED   Candida tropicalis NOT DETECTED NOT DETECTED   Margarite Gouge, PharmD PGY2 ID Pharmacy Resident [phone]  07/02/2020  3:41 PM

## 2020-07-02 NOTE — Progress Notes (Signed)
PROGRESS NOTE                                                                                                                                                                                                             Patient Demographics:    Marvin Brady, is a 34 y.o. male, DOB - 09/16/1986, WUX:[id number]  Outpatient Primary MD for the patient is Patient, No Pcp Per   Admit date - 07/01/2020   LOS - 1  Chief Complaint  Patient presents with  . Near Syncope       Brief Narrative: Patient is a 34 y.o. male with no significant past medical history-diagnosed with COVID-19 on 7/20 at a local urgent care-presented to the hospital with lightheadedness, cough along with fevers-found to have acute hypoxic respiratory failure secondary to COVID-19 pneumonia.  See below for further details.    Significant Events: 7/20>> diagnosed with COVID-19 at a local urgent care per patient report 7/26>> admit to [facility] for COVID-19 pneumonia with hypoxia  Significant studies: 7/26>> chest x-ray: Bibasilar peripheral hazy opacities-suspicious for COVID-19 pneumonia 7/26>> CTA chest: No PE-multifocal airspace disease in both lungs  COVID-19 medications: Steroids: 7/26>> Remdesivir: 7/26>>  Antibiotics: None  Microbiology data: 7/26>> blood cultures: 1/2-gram-positive cocci in clusters  Procedures: None  Consults: None  DVT prophylaxis: enoxaparin (LOVENOX) injection 40 mg Start: 07/02/20 1000     Subjective:    Nikolai Wilczak today feels better than yesterday-he was titrated off oxygen this morning.   Assessment  & Plan :   Sepsis with acute Hypoxic Resp Failure due to Covid 19 Viral pneumonia: Clinically improved-sepsis physiology has resolved-titrated off oxygen this morning-continue steroids/remdesivir.  Continue to follow inflammatory markers-and clinical course-hopefully he will remain off oxygen.  Ambulate and see how  he does.  Fever: afebrile  O2 requirements:  SpO2: 92 % O2 Flow Rate (L/min): 1 L/min   COVID-19 Labs: Recent Labs    07/01/20 2035 07/02/20 0451  DDIMER 0.63* 0.61*  FERRITIN  --  508*  LDH 273*  --   CRP 5.7* 7.1*       Component Value Date/Time   BNP 14.3 07/01/2020 2036    Recent Labs  Lab 07/01/20 2035  PROCALCITON <0.10    Lab Results  Component Value Date   SARSCOV2NAA POSITIVE (A) 07/01/2020     Prone/Incentive Spirometry:  encouraged  incentive spirometry use 3-4/hour  Gram-positive bacteremia: 1/2 blood cultures positive for gram-positive cocci-could be a contaminant-await further speciation/BCID-patient clinically improved-given overall stability suspect could be monitored off antimicrobial therapy for now.  Transaminitis: Likely secondary to COVID-19-stable for follow-up for now.   ABG: No results found for: PHART, PCO2ART, PO2ART, HCO3, TCO2, ACIDBASEDEF, O2SAT  Vent Settings: N/A  Condition - Stable  Family Communication  : We will update family over the next few days-currently patient very stable.  Code Status :  Full Code  Diet :  Diet Order            Diet Heart Room service appropriate? Yes; Fluid consistency: Thin  Diet effective now                  Disposition Plan  :  Status is: Inpatient  Remains inpatient appropriate because:Inpatient level of care appropriate due to severity of illness   Dispo: The patient is from: Home              Anticipated d/c is to: Home              Anticipated d/c date is: 3 days              Patient currently is not medically stable to d/c.   Barriers to discharge: Hypoxia requiring O2 supplementation/complete 5 days of IV Remdesivir-awaiting further ID of blood culture results.  Antimicorbials  :    Anti-infectives (From admission, onward)   Start     Dose/Rate Route Frequency Ordered Stop   07/03/20 1000  remdesivir 100 mg in sodium chloride 0.9 % 100 mL IVPB  Status:  Discontinued        "Followed by" Linked Group Details   100 mg 200 mL/hr over 30 Minutes Intravenous Daily 07/01/20 2314 07/02/20 1332   07/02/20 1332  remdesivir 100 mg in sodium chloride 0.9 % 100 mL IVPB     Discontinue    "Followed by" Linked Group Details   100 mg 200 mL/hr over 30 Minutes Intravenous Daily 07/02/20 1332 07/06/20 0959   07/02/20 0000  remdesivir 200 mg in sodium chloride 0.9% 250 mL IVPB       "Followed by" Linked Group Details   200 mg 580 mL/hr over 30 Minutes Intravenous Once 07/01/20 2314 07/02/20 0050      Inpatient Medications  Scheduled Meds: . enoxaparin (LOVENOX) injection  40 mg Subcutaneous Q24H  . mouth rinse  15 mL Mouth Rinse BID  . methylPREDNISolone (SOLU-MEDROL) injection  40 mg Intravenous Q12H  . sodium chloride flush  3 mL Intravenous Q12H  . sodium chloride flush  3 mL Intravenous Q12H   Continuous Infusions: . sodium chloride    . remdesivir 100 mg in NS 100 mL     PRN Meds:.sodium chloride, acetaminophen, guaiFENesin-dextromethorphan, HYDROcodone-acetaminophen, ondansetron **OR** ondansetron (ZOFRAN) IV, sodium chloride flush   Time Spent in minutes  25  See all Orders from today for further details   Jeoffrey Massed M.D on 07/02/2020 at 3:21 PM  To page go to [email or web address] - use universal password  [facility] -  Office  [phone]    Objective:   Vitals:   07/02/20 0630 07/02/20 0650 07/02/20 0731 07/02/20 1145  BP:   112/72 124/82  Pulse: 57 62 72 77  Resp: 21 22 17 21   Temp:   98.4 F (36.9 C) 97.8 F (36.6 C)  TempSrc:   Oral Oral  SpO2: 96% 93% 94% 92%  Weight:      Height:        Wt Readings from Last 3 Encounters:  07/02/20 78.9 kg     Intake/Output Summary (Last 24 hours) at 07/02/2020 1521 Last data filed at 07/02/2020 0600 Gross per 24 hour  Intake 1373 ml  Output 400 ml  Net 973 ml     Physical Exam Gen Exam:Alert awake-not in any distress HEENT:atraumatic, normocephalic Chest: B/L clear to  auscultation anteriorly CVS:S1S2 regular Abdomen:soft non tender, non distended Extremities:no edema Neurology: Non focal Skin: no rash   Data Review:    CBC Recent Labs  Lab 07/01/20 1306 07/02/20 0451  WBC 4.8 3.6*  HGB 14.5 12.8*  HCT 43.6 37.1*  PLT 190 183  MCV 87.6 85.3  MCH 29.1 29.4  MCHC 33.3 34.5  RDW 11.4* 11.3*  LYMPHSABS 1.1 0.5*  MONOABS 0.5 0.2  EOSABS 0.0 0.0  BASOSABS 0.0 0.0    Chemistries  Recent Labs  Lab 07/01/20 1306 07/02/20 0451  NA 136 136  K 3.9 4.2  CL 98 104  CO2 25 23  GLUCOSE 112* 169*  BUN 9 11  CREATININE 1.17 0.91  CALCIUM 8.8* 8.7*  MG  --  1.9  AST 36 87*  ALT 41 96*  ALKPHOS 68 66  BILITOT 1.3* 1.0   ------------------------------------------------------------------------------------------------------------------ No results for input(s): CHOL, HDL, LDLCALC, TRIG, CHOLHDL, LDLDIRECT in the last 72 hours.  No results found for: HGBA1C ------------------------------------------------------------------------------------------------------------------ No results for input(s): TSH, T4TOTAL, T3FREE, THYROIDAB in the last 72 hours.  Invalid input(s): FREET3 ------------------------------------------------------------------------------------------------------------------ Recent Labs    07/02/20 0451  FERRITIN 508*    Coagulation profile Recent Labs  Lab 07/01/20 1306  INR 1.0    Recent Labs    07/01/20 2035 07/02/20 0451  DDIMER 0.63* 0.61*    Cardiac Enzymes No results for input(s): CKMB, TROPONINI, MYOGLOBIN in the last 168 hours.  Invalid input(s): CK ------------------------------------------------------------------------------------------------------------------    Component Value Date/Time   BNP 14.3 07/01/2020 2036    Micro Results Recent Results (from the past 240 hour(s))  Culture, blood (Routine x 2)     Status: None (Preliminary result)   Collection Time: 07/01/20  1:06 PM   Specimen: BLOOD   Result Value Ref Range Status   Specimen Description BLOOD RIGHT ANTECUBITAL  Final   Special Requests   Final    BOTTLES DRAWN AEROBIC AND ANAEROBIC Blood Culture adequate volume   Culture   Final    NO GROWTH < 24 HOURS Performed at [facility], [address] [place], [place] [address]    Report Status PENDING  Incomplete  Culture, blood (Routine x 2)     Status: None (Preliminary result)   Collection Time: 07/01/20  7:42 PM   Specimen: BLOOD  Result Value Ref Range Status   Specimen Description BLOOD LEFT ANTECUBITAL  Final   Special Requests   Final    BOTTLES DRAWN AEROBIC AND ANAEROBIC Blood Culture adequate volume   Culture  Setup Time   Final    GRAM POSITIVE COCCI IN CLUSTERS Organism ID to follow    Culture   Final    NO GROWTH < 24 HOURS Performed at [facility], [address] [place], [place] [address]    Report Status PENDING  Incomplete  SARS Coronavirus 2 by RT PCR (hospital order, performed in [facility] hospital lab) Nasopharyngeal Nasopharyngeal Swab     Status: Abnormal   Collection Time: 07/01/20  9:29 PM  Specimen: Nasopharyngeal Swab  Result Value Ref Range Status   SARS Coronavirus 2 POSITIVE (A) NEGATIVE Final    Comment: RESULT CALLED TO, READ BACK BY AND VERIFIED WITH: C HARRIS RN 2305 07/01/20 A BROWNING (NOTE) SARS-CoV-2 target nucleic acids are DETECTED  SARS-CoV-2 RNA is generally detectable in upper respiratory specimens  during the acute phase of infection.  Positive results are indicative  of the presence of the identified virus, but do not rule out bacterial infection or co-infection with other pathogens not detected by the test.  Clinical correlation with patient history and  other diagnostic information is necessary to determine patient infection status.  The expected result is negative.  Fact Sheet for Patients:   BoilerBrush.com.cy   Fact Sheet for Healthcare Providers:     https://pope.com/    This test is not yet approved or cleared by the [place] FDA and  has been authorized for detection and/or diagnosis of SARS-CoV-2 by FDA under an Emergency Use Authorization (EUA).  This EUA will remain in effect (meaning this t est can be used) for the duration of  the COVID-19 declaration under Section 564(b)(1) of the Act, 21 U.S.C. section 360-bbb-3(b)(1), unless the authorization is terminated or revoked sooner.  Performed at [facility], [address], [place], [place] [address]     Radiology Reports CT ANGIO CHEST PE W OR WO CONTRAST  Result Date: 07/01/2020 CLINICAL DATA:  Chest pain and shortness of breath COVID EXAM: CT ANGIOGRAPHY CHEST WITH CONTRAST TECHNIQUE: Multidetector CT imaging of the chest was performed using the standard protocol during bolus administration of intravenous contrast. Multiplanar CT image reconstructions and MIPs were obtained to evaluate the vascular anatomy. CONTRAST:  46mL OMNIPAQUE IOHEXOL 350 MG/ML SOLN COMPARISON:  None. FINDINGS: Cardiovascular: There is slightly suboptimal opacification of the main pulmonary artery. No central or segmental pulmonary embolism is seen. Limited evaluation of the subsegmental arterial branches. The heart is normal in size. No pericardial effusion or thickening. No evidence right heart strain. There is normal three-vessel brachiocephalic anatomy without proximal stenosis. The thoracic aorta is normal in appearance. Mediastinum/Nodes: No hilar, mediastinal, or axillary adenopathy. Thyroid gland, trachea, and esophagus demonstrate no significant findings. Lungs/Pleura: Patchy rounded areas consolidation are seen at the posterior bilateral lung bases. There air bronchogram seen at the right lung base. No effusion is seen. Upper Abdomen: No acute abnormalities present in the visualized portions of the upper abdomen. Musculoskeletal: No chest wall abnormality. No acute or  significant osseous findings. Review of the MIP images confirms the above findings. IMPRESSION: Slightly suboptimal opacification of the main pulmonary artery, no central or segmental pulmonary embolism. Multifocal airspace opacities in both lower lungs, consistent with multifocal pneumonia. Electronically Signed   By: Jonna Clark M.D.   On: 07/01/2020 23:06   DG Chest Portable 1 View  Result Date: 07/01/2020 CLINICAL DATA:  Short of breath EXAM: PORTABLE CHEST 1 VIEW COMPARISON:  March 09, 2006 FINDINGS: The cardiomediastinal silhouette is normal in contour. No pleural effusion. No pneumothorax. Bibasilar peripheral predominant hazy opacities. Visualized abdomen is unremarkable. No acute osseous abnormality noted. IMPRESSION: Bibasilar peripheral predominant hazy opacities are suspicious for COVID-19 pneumonia. Electronically Signed   By: Meda Klinefelter MD   On: 07/01/2020 13:14

## 2020-07-03 LAB — CBC WITH DIFFERENTIAL/PLATELET
Abs Immature Granulocytes: 0.05 10*3/uL (ref 0.00–0.07)
Basophils Absolute: 0 10*3/uL (ref 0.0–0.1)
Basophils Relative: 0 %
Eosinophils Absolute: 0 10*3/uL (ref 0.0–0.5)
Eosinophils Relative: 0 %
HCT: 40.3 % (ref 39.0–52.0)
Hemoglobin: 14.2 g/dL (ref 13.0–17.0)
Immature Granulocytes: 1 %
Lymphocytes Relative: 14 %
Lymphs Abs: 1 10*3/uL (ref 0.7–4.0)
MCH: 30.5 pg (ref 26.0–34.0)
MCHC: 35.2 g/dL (ref 30.0–36.0)
MCV: 86.5 fL (ref 80.0–100.0)
Monocytes Absolute: 0.6 10*3/uL (ref 0.1–1.0)
Monocytes Relative: 8 %
Neutro Abs: 5.3 10*3/uL (ref 1.7–7.7)
Neutrophils Relative %: 77 %
Platelets: 268 10*3/uL (ref 150–400)
RBC: 4.66 MIL/uL (ref 4.22–5.81)
RDW: 11.5 % (ref 11.5–15.5)
WBC: 6.9 10*3/uL (ref 4.0–10.5)
nRBC: 0 % (ref 0.0–0.2)

## 2020-07-03 LAB — COMPREHENSIVE METABOLIC PANEL
ALT: 89 U/L — ABNORMAL HIGH (ref 0–44)
AST: 49 U/L — ABNORMAL HIGH (ref 15–41)
Albumin: 3.5 g/dL (ref 3.5–5.0)
Alkaline Phosphatase: 65 U/L (ref 38–126)
Anion gap: 11 (ref 5–15)
BUN: 15 mg/dL (ref 6–20)
CO2: 24 mmol/L (ref 22–32)
Calcium: 9.2 mg/dL (ref 8.9–10.3)
Chloride: 104 mmol/L (ref 98–111)
Creatinine, Ser: 0.88 mg/dL (ref 0.61–1.24)
GFR calc Af Amer: >60 mL/min (ref >60)
GFR calc non Af Amer: >60 mL/min (ref >60)
Glucose, Bld: 168 mg/dL — ABNORMAL HIGH (ref 70–99)
Potassium: 4.7 mmol/L (ref 3.5–5.1)
Sodium: 139 mmol/L (ref 135–145)
Total Bilirubin: 0.8 mg/dL (ref 0.3–1.2)
Total Protein: 7 g/dL (ref 6.5–8.1)

## 2020-07-03 LAB — FERRITIN: Ferritin: 533 ng/mL — ABNORMAL HIGH (ref 24–336)

## 2020-07-03 LAB — D-DIMER, QUANTITATIVE: D-Dimer, Quant: 0.38 ug/mL-FEU (ref 0.00–0.50)

## 2020-07-03 LAB — C-REACTIVE PROTEIN: CRP: 4.4 mg/dL — ABNORMAL HIGH (ref <1.0)

## 2020-07-03 NOTE — Progress Notes (Signed)
PROGRESS NOTE                                                                                                                                                                                                             Patient Demographics:    Marvin Brady, is a 34 y.o. male, DOB - 04-05-1986, WUJ:[id number]  Outpatient Primary MD for the patient is Patient, No Pcp Per   Admit date - 07/01/2020   LOS - 2  Chief Complaint  Patient presents with   Near Syncope       Brief Narrative: Patient is a 34 y.o. male with no significant past medical history-diagnosed with COVID-19 on 7/20 at a local urgent care-presented to the hospital with lightheadedness, cough along with fevers-found to have acute hypoxic respiratory failure secondary to COVID-19 pneumonia.  See below for further details.    Significant Events: 7/20>> diagnosed with COVID-19 at a local urgent care per patient report 7/26>> admit to [facility] for COVID-19 pneumonia with hypoxia  Significant studies: 7/26>> chest x-ray: Bibasilar peripheral hazy opacities-suspicious for COVID-19 pneumonia 7/26>> CTA chest: No PE-multifocal airspace disease in both lungs  COVID-19 medications: Steroids: 7/26>> Remdesivir: 7/26>>  Antibiotics: None  Microbiology data: 7/26>> blood cultures: 1/2-gram-positive cocci in clusters  Procedures: None  Consults: None  DVT prophylaxis: enoxaparin (LOVENOX) injection 40 mg Start: 07/02/20 1000     Subjective:    Marvin Brady today continues to feel better-less cough-remains off oxygen.  Does get mildly short of breath when he ambulates in the room.   Assessment  & Plan :   Sepsis with acute Hypoxic Resp Failure due to Covid 19 Viral pneumonia: Clinically improving-sepsis physiology has resolved-remains off oxygen-inflammatory markers downtrending-continue steroids/remdesivir.  Although improved-he is still somewhat short of  breath with ambulation-and not yet back to baseline.  Suspect will require 5 days of remdesivir to be completed before he can be discharged.  Continue supportive care-continue to encourage incentive spirometry/flutter valve/and mobilization is much as possible.    Fever: afebrile  O2 requirements:  SpO2: 98 % O2 Flow Rate (L/min): 1 L/min   COVID-19 Labs: Recent Labs    07/01/20 2035 07/02/20 0451 07/03/20 0433  DDIMER 0.63* 0.61* 0.38  FERRITIN  --  508* 533*  LDH 273*  --   --   CRP 5.7* 7.1* 4.4*  Component Value Date/Time   BNP 14.3 07/01/2020 2036    Recent Labs  Lab 07/01/20 2035  PROCALCITON <0.10    Lab Results  Component Value Date   SARSCOV2NAA POSITIVE (A) 07/01/2020     Prone/Incentive Spirometry: encouraged  incentive spirometry use 3-4/hour  Coagulase negative bacteremia: 1/2 blood cultures positive coagulase-negative staph-likely contaminant-no further work-up required.  Transaminitis: Likely secondary to COVID-19-stable for follow-up for now.   ABG: No results found for: PHART, PCO2ART, PO2ART, HCO3, TCO2, ACIDBASEDEF, O2SAT  Vent Settings: N/A  Condition - Stable  Family Communication  : We will update family over the next few days-currently patient very stable.  Code Status :  Full Code  Diet :  Diet Order            Diet Heart Room service appropriate? Yes; Fluid consistency: Thin  Diet effective now                  Disposition Plan  :  Status is: Inpatient  Remains inpatient appropriate because:Inpatient level of care appropriate due to severity of illness   Dispo: The patient is from: Home              Anticipated d/c is to: Home              Anticipated d/c date is: 3 days              Patient currently is not medically stable to d/c.   Barriers to discharge: Hypoxia requiring O2 supplementation/complete 5 days of IV Remdesivir Antimicorbials  :    Anti-infectives (From admission, onward)   Start     Dose/Rate  Route Frequency Ordered Stop   07/03/20 1000  remdesivir 100 mg in sodium chloride 0.9 % 100 mL IVPB  Status:  Discontinued       "Followed by" Linked Group Details   100 mg 200 mL/hr over 30 Minutes Intravenous Daily 07/01/20 2314 07/02/20 1332   07/02/20 1332  remdesivir 100 mg in sodium chloride 0.9 % 100 mL IVPB     Discontinue    "Followed by" Linked Group Details   100 mg 200 mL/hr over 30 Minutes Intravenous Daily 07/02/20 1332 07/06/20 0959   07/02/20 0000  remdesivir 200 mg in sodium chloride 0.9% 250 mL IVPB       "Followed by" Linked Group Details   200 mg 580 mL/hr over 30 Minutes Intravenous Once 07/01/20 2314 07/02/20 0050      Inpatient Medications  Scheduled Meds:  enoxaparin (LOVENOX) injection  40 mg Subcutaneous Q24H   mouth rinse  15 mL Mouth Rinse BID   methylPREDNISolone (SOLU-MEDROL) injection  40 mg Intravenous Q12H   sodium chloride flush  3 mL Intravenous Q12H   sodium chloride flush  3 mL Intravenous Q12H   Continuous Infusions:  sodium chloride     remdesivir 100 mg in NS 100 mL 100 mg (07/03/20 0910)   PRN Meds:.sodium chloride, acetaminophen, guaiFENesin-dextromethorphan, HYDROcodone-acetaminophen, ondansetron **OR** ondansetron (ZOFRAN) IV, sodium chloride flush   Time Spent in minutes  25  See all Orders from today for further details   Jeoffrey MassedShanker Meckenzie Balsley M.D on 07/03/2020 at 2:16 PM  To page go to [email or web address] - use universal password  [facility] -  Office  [phone]    Objective:   Vitals:   07/02/20 1910 07/02/20 2019 07/03/20 0512 07/03/20 0823  BP:  (!) 130/71 107/71 (!) 100/58  Pulse: 75 78 62 68  Resp: (!) 25 (!) 24  21 16  Temp:  97.9 F (36.6 C) 98.1 F (36.7 C) 98.6 F (37 C)  TempSrc:  Oral Oral Oral  SpO2: 91% 92% 92% 98%  Weight:      Height:        Wt Readings from Last 3 Encounters:  07/02/20 78.9 kg     Intake/Output Summary (Last 24 hours) at 07/03/2020 1416 Last data filed at 07/03/2020  0900 Gross per 24 hour  Intake 726 ml  Output 600 ml  Net 126 ml     Physical Exam Gen Exam:Alert awake-not in any distress HEENT:atraumatic, normocephalic Chest: B/L clear to auscultation anteriorly CVS:S1S2 regular Abdomen:soft non tender, non distended Extremities:no edema Neurology: Non focal Skin: no rash   Data Review:    CBC Recent Labs  Lab 07/01/20 1306 07/02/20 0451 07/03/20 0433  WBC 4.8 3.6* 6.9  HGB 14.5 12.8* 14.2  HCT 43.6 37.1* 40.3  PLT 190 183 268  MCV 87.6 85.3 86.5  MCH 29.1 29.4 30.5  MCHC 33.3 34.5 35.2  RDW 11.4* 11.3* 11.5  LYMPHSABS 1.1 0.5* 1.0  MONOABS 0.5 0.2 0.6  EOSABS 0.0 0.0 0.0  BASOSABS 0.0 0.0 0.0    Chemistries  Recent Labs  Lab 07/01/20 1306 07/02/20 0451 07/03/20 0433  NA 136 136 139  K 3.9 4.2 4.7  CL 98 104 104  CO2 25 23 24   GLUCOSE 112* 169* 168*  BUN 9 11 15   CREATININE 1.17 0.91 0.88  CALCIUM 8.8* 8.7* 9.2  MG  --  1.9  --   AST 36 87* 49*  ALT 41 96* 89*  ALKPHOS 68 66 65  BILITOT 1.3* 1.0 0.8   ------------------------------------------------------------------------------------------------------------------ No results for input(s): CHOL, HDL, LDLCALC, TRIG, CHOLHDL, LDLDIRECT in the last 72 hours.  No results found for: HGBA1C ------------------------------------------------------------------------------------------------------------------ No results for input(s): TSH, T4TOTAL, T3FREE, THYROIDAB in the last 72 hours.  Invalid input(s): FREET3 ------------------------------------------------------------------------------------------------------------------ Recent Labs    07/02/20 0451 07/03/20 0433  FERRITIN 508* 533*    Coagulation profile Recent Labs  Lab 07/01/20 1306  INR 1.0    Recent Labs    07/02/20 0451 07/03/20 0433  DDIMER 0.61* 0.38    Cardiac Enzymes No results for input(s): CKMB, TROPONINI, MYOGLOBIN in the last 168 hours.  Invalid input(s):  CK ------------------------------------------------------------------------------------------------------------------    Component Value Date/Time   BNP 14.3 07/01/2020 2036    Micro Results Recent Results (from the past 240 hour(s))  Culture, blood (Routine x 2)     Status: None (Preliminary result)   Collection Time: 07/01/20  1:06 PM   Specimen: BLOOD  Result Value Ref Range Status   Specimen Description BLOOD RIGHT ANTECUBITAL  Final   Special Requests   Final    BOTTLES DRAWN AEROBIC AND ANAEROBIC Blood Culture adequate volume   Culture   Final    NO GROWTH < 24 HOURS Performed at [facility], [address] [place], [address] [place]    Report Status PENDING  Incomplete  Culture, blood (Routine x 2)     Status: Abnormal (Preliminary result)   Collection Time: 07/01/20  7:42 PM   Specimen: BLOOD  Result Value Ref Range Status   Specimen Description BLOOD LEFT ANTECUBITAL  Final   Special Requests   Final    BOTTLES DRAWN AEROBIC AND ANAEROBIC Blood Culture adequate volume   Culture  Setup Time   Final    GRAM POSITIVE COCCI IN CLUSTERS Organism ID to follow CRITICAL RESULT CALLED TO, READ  BACK BY AND VERIFIED WITH: A WOLFE PHARMD [id number] 07/02/20 A BROWNING Performed at [facility], [address], [place], [place] [address]    Culture STAPHYLOCOCCUS SPECIES (COAGULASE NEGATIVE) (A)  Final   Report Status PENDING  Incomplete  Blood Culture ID Panel (Reflexed)     Status: Abnormal   Collection Time: 07/01/20  7:42 PM  Result Value Ref Range Status   Enterococcus species NOT DETECTED NOT DETECTED Final   Listeria monocytogenes NOT DETECTED NOT DETECTED Final   Staphylococcus species DETECTED (A) NOT DETECTED Final    Comment: Methicillin (oxacillin) susceptible coagulase negative staphylococcus. Possible blood culture contaminant (unless isolated from more than one blood culture draw or clinical case suggests pathogenicity). No antibiotic treatment is indicated  for blood  culture contaminants. CRITICAL RESULT CALLED TO, READ BACK BY AND VERIFIED WITH: A WOLFE PHARMD 1528 07/02/20 A BROWNING    Staphylococcus aureus (BCID) NOT DETECTED NOT DETECTED Final   Methicillin resistance NOT DETECTED NOT DETECTED Final   Streptococcus species NOT DETECTED NOT DETECTED Final   Streptococcus agalactiae NOT DETECTED NOT DETECTED Final   Streptococcus pneumoniae NOT DETECTED NOT DETECTED Final   Streptococcus pyogenes NOT DETECTED NOT DETECTED Final   Acinetobacter baumannii NOT DETECTED NOT DETECTED Final   Enterobacteriaceae species NOT DETECTED NOT DETECTED Final   Enterobacter cloacae complex NOT DETECTED NOT DETECTED Final   Escherichia coli NOT DETECTED NOT DETECTED Final   Klebsiella oxytoca NOT DETECTED NOT DETECTED Final   Klebsiella pneumoniae NOT DETECTED NOT DETECTED Final   Proteus species NOT DETECTED NOT DETECTED Final   Serratia marcescens NOT DETECTED NOT DETECTED Final   Haemophilus influenzae NOT DETECTED NOT DETECTED Final   Neisseria meningitidis NOT DETECTED NOT DETECTED Final   Pseudomonas aeruginosa NOT DETECTED NOT DETECTED Final   Candida albicans NOT DETECTED NOT DETECTED Final   Candida glabrata NOT DETECTED NOT DETECTED Final   Candida krusei NOT DETECTED NOT DETECTED Final   Candida parapsilosis NOT DETECTED NOT DETECTED Final   Candida tropicalis NOT DETECTED NOT DETECTED Final    Comment: Performed at [facility], [address] [place], [place] [address]  SARS Coronavirus 2 by RT PCR (hospital order, performed in [facility] hospital lab) Nasopharyngeal Nasopharyngeal Swab     Status: Abnormal   Collection Time: 07/01/20  9:29 PM   Specimen: Nasopharyngeal Swab  Result Value Ref Range Status   SARS Coronavirus 2 POSITIVE (A) NEGATIVE Final    Comment: RESULT CALLED TO, READ BACK BY AND VERIFIED WITH: C HARRIS RN [id number] 07/01/20 A BROWNING (NOTE) SARS-CoV-2 target nucleic acids are DETECTED  SARS-CoV-2 RNA is  generally detectable in upper respiratory specimens  during the acute phase of infection.  Positive results are indicative  of the presence of the identified virus, but do not rule out bacterial infection or co-infection with other pathogens not detected by the test.  Clinical correlation with patient history and  other diagnostic information is necessary to determine patient infection status.  The expected result is negative.  Fact Sheet for Patients:   BoilerBrush.com.cy   Fact Sheet for Healthcare Providers:   https://pope.com/    This test is not yet approved or cleared by the [place] FDA and  has been authorized for detection and/or diagnosis of SARS-CoV-2 by FDA under an Emergency Use Authorization (EUA).  This EUA will remain in effect (meaning this t est can be used) for the duration of  the COVID-19 declaration under Section 564(b)(1) of  the Act, 21 U.S.C. section 360-bbb-3(b)(1), unless the authorization is terminated or revoked sooner.  Performed at [facility], [address], [place], [place] [address]     Radiology Reports CT ANGIO CHEST PE W OR WO CONTRAST  Result Date: 07/01/2020 CLINICAL DATA:  Chest pain and shortness of breath COVID EXAM: CT ANGIOGRAPHY CHEST WITH CONTRAST TECHNIQUE: Multidetector CT imaging of the chest was performed using the standard protocol during bolus administration of intravenous contrast. Multiplanar CT image reconstructions and MIPs were obtained to evaluate the vascular anatomy. CONTRAST:  93mL OMNIPAQUE IOHEXOL 350 MG/ML SOLN COMPARISON:  None. FINDINGS: Cardiovascular: There is slightly suboptimal opacification of the main pulmonary artery. No central or segmental pulmonary embolism is seen. Limited evaluation of the subsegmental arterial branches. The heart is normal in size. No pericardial effusion or thickening. No evidence right heart strain. There is normal three-vessel  brachiocephalic anatomy without proximal stenosis. The thoracic aorta is normal in appearance. Mediastinum/Nodes: No hilar, mediastinal, or axillary adenopathy. Thyroid gland, trachea, and esophagus demonstrate no significant findings. Lungs/Pleura: Patchy rounded areas consolidation are seen at the posterior bilateral lung bases. There air bronchogram seen at the right lung base. No effusion is seen. Upper Abdomen: No acute abnormalities present in the visualized portions of the upper abdomen. Musculoskeletal: No chest wall abnormality. No acute or significant osseous findings. Review of the MIP images confirms the above findings. IMPRESSION: Slightly suboptimal opacification of the main pulmonary artery, no central or segmental pulmonary embolism. Multifocal airspace opacities in both lower lungs, consistent with multifocal pneumonia. Electronically Signed   By: Jonna Clark M.D.   On: 07/01/2020 23:06   DG Chest Portable 1 View  Result Date: 07/01/2020 CLINICAL DATA:  Short of breath EXAM: PORTABLE CHEST 1 VIEW COMPARISON:  March 09, 2006 FINDINGS: The cardiomediastinal silhouette is normal in contour. No pleural effusion. No pneumothorax. Bibasilar peripheral predominant hazy opacities. Visualized abdomen is unremarkable. No acute osseous abnormality noted. IMPRESSION: Bibasilar peripheral predominant hazy opacities are suspicious for COVID-19 pneumonia. Electronically Signed   By: Meda Klinefelter MD   On: 07/01/2020 13:14

## 2020-07-04 LAB — CBC WITH DIFFERENTIAL/PLATELET
Abs Immature Granulocytes: 0.09 10*3/uL — ABNORMAL HIGH (ref 0.00–0.07)
Basophils Absolute: 0 10*3/uL (ref 0.0–0.1)
Basophils Relative: 0 %
Eosinophils Absolute: 0 10*3/uL (ref 0.0–0.5)
Eosinophils Relative: 0 %
HCT: 39.3 % (ref 39.0–52.0)
Hemoglobin: 14.1 g/dL (ref 13.0–17.0)
Immature Granulocytes: 1 %
Lymphocytes Relative: 10 %
Lymphs Abs: 1.1 10*3/uL (ref 0.7–4.0)
MCH: 31.8 pg (ref 26.0–34.0)
MCHC: 35.9 g/dL (ref 30.0–36.0)
MCV: 88.7 fL (ref 80.0–100.0)
Monocytes Absolute: 0.6 10*3/uL (ref 0.1–1.0)
Monocytes Relative: 6 %
Neutro Abs: 9.1 10*3/uL — ABNORMAL HIGH (ref 1.7–7.7)
Neutrophils Relative %: 83 %
Platelets: 332 10*3/uL (ref 150–400)
RBC: 4.43 MIL/uL (ref 4.22–5.81)
RDW: 11.5 % (ref 11.5–15.5)
WBC: 11 10*3/uL — ABNORMAL HIGH (ref 4.0–10.5)
nRBC: 0 % (ref 0.0–0.2)

## 2020-07-04 LAB — COMPREHENSIVE METABOLIC PANEL
ALT: 308 U/L — ABNORMAL HIGH (ref 0–44)
AST: 126 U/L — ABNORMAL HIGH (ref 15–41)
Albumin: 3.3 g/dL — ABNORMAL LOW (ref 3.5–5.0)
Alkaline Phosphatase: 62 U/L (ref 38–126)
Anion gap: 10 (ref 5–15)
BUN: 17 mg/dL (ref 6–20)
CO2: 25 mmol/L (ref 22–32)
Calcium: 9 mg/dL (ref 8.9–10.3)
Chloride: 104 mmol/L (ref 98–111)
Creatinine, Ser: 0.85 mg/dL (ref 0.61–1.24)
GFR calc Af Amer: >60 mL/min (ref >60)
GFR calc non Af Amer: >60 mL/min (ref >60)
Glucose, Bld: 169 mg/dL — ABNORMAL HIGH (ref 70–99)
Potassium: 4.6 mmol/L (ref 3.5–5.1)
Sodium: 139 mmol/L (ref 135–145)
Total Bilirubin: 0.7 mg/dL (ref 0.3–1.2)
Total Protein: 6.5 g/dL (ref 6.5–8.1)

## 2020-07-04 LAB — CULTURE, BLOOD (ROUTINE X 2): Special Requests: ADEQUATE

## 2020-07-04 LAB — FERRITIN: Ferritin: 620 ng/mL — ABNORMAL HIGH (ref 24–336)

## 2020-07-04 LAB — C-REACTIVE PROTEIN: CRP: 1.6 mg/dL — ABNORMAL HIGH (ref <1.0)

## 2020-07-04 LAB — D-DIMER, QUANTITATIVE: D-Dimer, Quant: <0.27 ug/mL-FEU (ref 0.00–0.50)

## 2020-07-04 NOTE — Progress Notes (Addendum)
PROGRESS NOTE                                                                                                                                                                                                             Patient Demographics:    Marvin Marvin Brady Marvin Brady, is a 34 y.o. male, DOB - August 01, 1986, ZOX:[id number]  Outpatient Primary MD for the patient is Marvin Marvin Brady   Admit date - 07/01/2020   LOS - 3  Chief Complaint  Patient presents with  . Near Syncope       Brief Narrative: Patient is a 34 y.o. male with no significant past medical history-diagnosed with COVID-19 on 7/20 at a local urgent care-presented to the hospital with lightheadedness, cough along with fevers-found to have acute hypoxic respiratory failure secondary to COVID-19 pneumonia.  See below for further details.    Significant Events: 7/20>> diagnosed with COVID-19 at a local urgent care Brady patient report 7/26>> [facility]19 pneumonia with hypoxia  Significant studies: 7/26>> chest x-ray: Bibasilar peripheral hazy opacities-suspicious for COVID-19 pneumonia 7/26>> CTA chest: No PE-multifocal airspace disease in both lungs  COVID-19 medications: Steroids: 7/26>> Remdesivir: 7/26>>  Antibiotics: None  Microbiology data: 7/26>> blood cultures: 1/2-gram-positive cocci in clusters  Procedures: None  Consults: None  DVT prophylaxis: enoxaparin (LOVENOX) injection 40 mg Start: 07/02/20 1000     Subjective:   No major issues overnight-stable on room air-exertional dyspnea has improved with patient-still with some cough.   Assessment  & Plan :   Sepsis with acute Hypoxic Resp Failure due to Covid 19 Viral pneumonia: Improved-sepsis physiology has resolved-remains on room air at rest-exertional dyspnea improving as well.  Will complete remdesivir on 7/13-remains on steroids.  Suspect if clinical improvement continues-stable  for discharge on 7/30.    Fever: afebrile  O2 requirements:  SpO2: 91 % O2 Flow Rate (L/min): 1 L/min   COVID-19 Labs: Recent Labs    07/01/20 2035 07/01/20 2035 07/02/20 0451 07/03/20 0433 07/04/20 0412  DDIMER 0.63*   < > 0.61* 0.38 <0.27  FERRITIN  --   --  508* 533* 620*  LDH 273*  --   --   --   --   CRP 5.7*   < > 7.1* 4.4* 1.6*   < > = values in this interval not displayed.  Component Value Date/Time   BNP 14.3 07/01/2020 2036    Recent Labs  Lab 07/01/20 2035  PROCALCITON <0.10    Lab Results  Component Value Date   SARSCOV2NAA POSITIVE (A) 07/01/2020     Prone/Incentive Spirometry: encouraged  incentive spirometry use 3-4/hour  Coagulase negative bacteremia: 1/2 blood cultures positive coagulase-negative staph-likely contaminant-no further work-up required.  Transaminitis: Likely secondary to COVID-19-stable for follow-up for now.   ABG: No results found for: PHART, PCO2ART, PO2ART, HCO3, TCO2, ACIDBASEDEF, O2SAT  Vent Settings: N/A  Condition - Stable  Family Communication  : We will update family over the next few days-currently patient very stable.  Code Status :  Full Code  Diet :  Diet Order            Diet Heart Room service appropriate? Yes; Fluid consistency: Thin  Diet effective now                  Disposition Plan  :  Status is: Inpatient  Remains inpatient appropriate because:Inpatient level of care appropriate due to severity of illness   Dispo: The patient is from: Home              Anticipated d/c is to: Home              Anticipated d/c date is: 1 days-7/30              Patient currently is not medically stable to d/c.   Barriers to discharge: Hypoxia requiring O2 supplementation/complete 5 days of IV Remdesivir Antimicorbials  :    Anti-infectives (From admission, onward)   Start     Dose/Rate Route Frequency Ordered Stop   07/03/20 1000  remdesivir 100 mg in sodium chloride 0.9 % 100 mL IVPB  Status:   Discontinued       "Followed by" Linked Group Details   100 mg 200 mL/hr over 30 Minutes Intravenous Daily 07/01/20 2314 07/02/20 1332   07/02/20 1332  remdesivir 100 mg in sodium chloride 0.9 % 100 mL IVPB     Discontinue    "Followed by" Linked Group Details   100 mg 200 mL/hr over 30 Minutes Intravenous Daily 07/02/20 1332 07/06/20 0959   07/02/20 0000  remdesivir 200 mg in sodium chloride 0.9% 250 mL IVPB       "Followed by" Linked Group Details   200 mg 580 mL/hr over 30 Minutes Intravenous Once 07/01/20 2314 07/02/20 0050      Inpatient Medications  Scheduled Meds: . enoxaparin (LOVENOX) injection  40 mg Subcutaneous Q24H  . mouth rinse  15 mL Mouth Rinse BID  . methylPREDNISolone (SOLU-MEDROL) injection  40 mg Intravenous Q12H  . sodium chloride flush  3 mL Intravenous Q12H  . sodium chloride flush  3 mL Intravenous Q12H   Continuous Infusions: . sodium chloride    . remdesivir 100 mg in NS 100 mL 100 mg (07/04/20 1014)   PRN Meds:.sodium chloride, acetaminophen, guaiFENesin-dextromethorphan, HYDROcodone-acetaminophen, ondansetron **OR** ondansetron (ZOFRAN) IV, sodium chloride flush   Time Spent in minutes  25  See all Orders from today for further details   Marvin Marvin Brady M.D on 07/04/2020 at 2:53 PM  To page go to [email or web address] - use universal password  [facility] -  Office  [phone]    Objective:   Vitals:   07/03/20 0823 07/03/20 2018 07/04/20 0500 07/04/20 1402  BP: (!) 100/58 114/72 112/69 113/69  Pulse: 68 76 59 69  Resp: 16 20 22  20  Temp: 98.6 F (37 C) 98.3 F (36.8 C) 97.8 F (36.6 C) 98.6 F (37 C)  TempSrc: Oral Oral Oral Oral  SpO2: 98% 92% 90% 91%  Weight:      Height:        Wt Readings from Last 3 Encounters:  07/02/20 78.9 kg     Intake/Output Summary (Last 24 hours) at 07/04/2020 1453 Last data filed at 07/04/2020 0900 Gross Brady 24 hour  Intake 440 ml  Output --  Net 440 ml     Physical Exam Gen Exam:Alert  awake-not in any distress HEENT:atraumatic, normocephalic Chest: B/L clear to auscultation anteriorly CVS:S1S2 regular Abdomen:soft non tender, non distended Extremities:no edema Neurology: Non focal Skin: no rash   Data Review:    CBC Recent Labs  Lab 07/01/20 1306 07/02/20 0451 07/03/20 0433 07/04/20 0412  WBC 4.8 3.6* 6.9 11.0*  HGB 14.5 12.8* 14.2 14.1  HCT 43.6 37.1* 40.3 39.3  PLT 190 183 268 332  MCV 87.6 85.3 86.5 88.7  MCH 29.1 29.4 30.5 31.8  MCHC 33.3 34.5 35.2 35.9  RDW 11.4* 11.3* 11.5 11.5  LYMPHSABS 1.1 0.5* 1.0 1.1  MONOABS 0.5 0.2 0.6 0.6  EOSABS 0.0 0.0 0.0 0.0  BASOSABS 0.0 0.0 0.0 0.0    Chemistries  Recent Labs  Lab 07/01/20 1306 07/02/20 0451 07/03/20 0433 07/04/20 0412  NA 136 136 139 139  K 3.9 4.2 4.7 4.6  CL 98 104 104 104  CO2 25 23 24 25   GLUCOSE 112* 169* 168* 169*  BUN 9 11 15 17   CREATININE 1.17 0.91 0.88 0.85  CALCIUM 8.8* 8.7* 9.2 9.0  MG  --  1.9  --   --   AST 36 87* 49* 126*  ALT 41 96* 89* 308*  ALKPHOS 68 66 65 62  BILITOT 1.3* 1.0 0.8 0.7   ------------------------------------------------------------------------------------------------------------------ No results for input(s): CHOL, HDL, LDLCALC, TRIG, CHOLHDL, LDLDIRECT in the last 72 hours.  No results found for: HGBA1C ------------------------------------------------------------------------------------------------------------------ No results for input(s): TSH, T4TOTAL, T3FREE, THYROIDAB in the last 72 hours.  Invalid input(s): FREET3 ------------------------------------------------------------------------------------------------------------------ Recent Labs    07/03/20 0433 07/04/20 0412  FERRITIN 533* 620*    Coagulation profile Recent Labs  Lab 07/01/20 1306  INR 1.0    Recent Labs    07/03/20 0433 07/04/20 0412  DDIMER 0.38 <0.27    Cardiac Enzymes No results for input(s): CKMB, TROPONINI, MYOGLOBIN in the last 168 hours.  Invalid  input(s): CK ------------------------------------------------------------------------------------------------------------------    Component Value Date/Time   BNP 14.3 07/01/2020 2036    Micro Results Recent Results (from the past 240 hour(s))  Culture, blood (Routine x 2)     Status: None (Preliminary result)   Collection Time: 07/01/20  1:06 PM   Specimen: BLOOD  Result Value Ref Range Status   Specimen Description BLOOD RIGHT ANTECUBITAL  Final   Special Requests   Final    BOTTLES DRAWN AEROBIC AND ANAEROBIC Blood Culture adequate volume   Culture   Final    NO GROWTH 3 DAYS Performed at [facility], [address] [place], [address] [place]    Report Status PENDING  Incomplete  Culture, blood (Routine x 2)     Status: Abnormal   Collection Time: 07/01/20  7:42 PM   Specimen: BLOOD  Result Value Ref Range Status   Specimen Description BLOOD LEFT ANTECUBITAL  Final   Special Requests   Final    BOTTLES DRAWN AEROBIC AND ANAEROBIC Blood Culture adequate  volume   Culture  Setup Time   Final    GRAM POSITIVE COCCI IN CLUSTERS Organism ID to follow CRITICAL RESULT CALLED TO, READ BACK BY AND VERIFIED WITH: A WOLFE PHARMD [id number] 07/02/20 A BROWNING    Culture (A)  Final    STAPHYLOCOCCUS SPECIES (COAGULASE NEGATIVE) THE SIGNIFICANCE OF ISOLATING THIS ORGANISM FROM A SINGLE SET OF BLOOD CULTURES WHEN MULTIPLE SETS ARE DRAWN IS UNCERTAIN. PLEASE NOTIFY THE MICROBIOLOGY DEPARTMENT WITHIN ONE WEEK IF SPECIATION AND SENSITIVITIES ARE REQUIRED. Performed at [facility], [address], [place], [place] [address]    Report Status 07/04/2020 FINAL  Final  Blood Culture ID Panel (Reflexed)     Status: Abnormal   Collection Time: 07/01/20  7:42 PM  Result Value Ref Range Status   Enterococcus species NOT DETECTED NOT DETECTED Final   Listeria monocytogenes NOT DETECTED NOT DETECTED Final   Staphylococcus species DETECTED (A) NOT DETECTED Final    Comment: Methicillin  (oxacillin) susceptible coagulase negative staphylococcus. Possible blood culture contaminant (unless isolated from more than one blood culture draw or clinical case suggests pathogenicity). No antibiotic treatment is indicated for blood  culture contaminants. CRITICAL RESULT CALLED TO, READ BACK BY AND VERIFIED WITH: A WOLFE PHARMD [id number] 07/02/20 A BROWNING    Staphylococcus aureus (BCID) NOT DETECTED NOT DETECTED Final   Methicillin resistance NOT DETECTED NOT DETECTED Final   Streptococcus species NOT DETECTED NOT DETECTED Final   Streptococcus agalactiae NOT DETECTED NOT DETECTED Final   Streptococcus pneumoniae NOT DETECTED NOT DETECTED Final   Streptococcus pyogenes NOT DETECTED NOT DETECTED Final   Acinetobacter baumannii NOT DETECTED NOT DETECTED Final   Enterobacteriaceae species NOT DETECTED NOT DETECTED Final   Enterobacter cloacae complex NOT DETECTED NOT DETECTED Final   Escherichia coli NOT DETECTED NOT DETECTED Final   Klebsiella oxytoca NOT DETECTED NOT DETECTED Final   Klebsiella pneumoniae NOT DETECTED NOT DETECTED Final   Proteus species NOT DETECTED NOT DETECTED Final   Serratia marcescens NOT DETECTED NOT DETECTED Final   Haemophilus influenzae NOT DETECTED NOT DETECTED Final   Neisseria meningitidis NOT DETECTED NOT DETECTED Final   Pseudomonas aeruginosa NOT DETECTED NOT DETECTED Final   Candida albicans NOT DETECTED NOT DETECTED Final   Candida glabrata NOT DETECTED NOT DETECTED Final   Candida krusei NOT DETECTED NOT DETECTED Final   Candida parapsilosis NOT DETECTED NOT DETECTED Final   Candida tropicalis NOT DETECTED NOT DETECTED Final    Comment: Performed at [facility], [address] [place], [place] [address]  SARS Coronavirus 2 by RT PCR (hospital order, performed in [facility] hospital lab) Nasopharyngeal Nasopharyngeal Swab     Status: Abnormal   Collection Time: 07/01/20  9:29 PM   Specimen: Nasopharyngeal Swab  Result Value Ref Range Status     SARS Coronavirus 2 POSITIVE (A) NEGATIVE Final    Comment: RESULT CALLED TO, READ BACK BY AND VERIFIED WITH: C HARRIS RN [id number] 07/01/20 A BROWNING (NOTE) SARS-CoV-2 target nucleic acids are DETECTED  SARS-CoV-2 RNA is generally detectable in upper respiratory specimens  during the acute phase of infection.  Positive results are indicative  of the presence of the identified virus, but do not rule out bacterial infection or co-infection with other pathogens not detected by the test.  Clinical correlation with patient history and  other diagnostic information is necessary to determine patient infection status.  The expected result is negative.  Fact Sheet for Patients:   BoilerBrush.com.cy   Fact Sheet  for Healthcare Providers:   https://pope.com/    This test is not yet approved or cleared by the [place] and  has been authorized for detection and/or diagnosis of SARS-CoV-2 by FDA under an Emergency Use Authorization (EUA).  This EUA will remain in effect (meaning this t est can be used) for the duration of  the COVID-19 declaration under Section 564(b)(1) of the Act, 21 U.S.C. section 360-bbb-3(b)(1), unless the authorization is terminated or revoked sooner.  Performed at [facility], [address] [place], [place] [address]     Radiology Reports CT ANGIO CHEST PE W OR WO CONTRAST  Result Date: 07/01/2020 CLINICAL DATA:  Chest pain and shortness of breath COVID EXAM: CT ANGIOGRAPHY CHEST WITH CONTRAST TECHNIQUE: Multidetector CT imaging of the chest was performed using the standard protocol during bolus administration of intravenous contrast. Multiplanar CT image reconstructions and MIPs were obtained to evaluate the vascular anatomy. CONTRAST:  61mL OMNIPAQUE IOHEXOL 350 MG/ML SOLN COMPARISON:  None. FINDINGS: Cardiovascular: There is slightly suboptimal opacification of the main pulmonary artery. No central or  segmental pulmonary embolism is seen. Limited evaluation of the subsegmental arterial branches. The heart is normal in size. No pericardial effusion or thickening. No evidence right heart strain. There is normal three-vessel brachiocephalic anatomy without proximal stenosis. The thoracic aorta is normal in appearance. Mediastinum/Nodes: No hilar, mediastinal, or axillary adenopathy. Thyroid gland, trachea, and esophagus demonstrate no significant findings. Lungs/Pleura: Patchy rounded areas consolidation are seen at the posterior bilateral lung bases. There air bronchogram seen at the right lung base. No effusion is seen. Upper Abdomen: No acute abnormalities present in the visualized portions of the upper abdomen. Musculoskeletal: No chest wall abnormality. No acute or significant osseous findings. Review of the MIP images confirms the above findings. IMPRESSION: Slightly suboptimal opacification of the main pulmonary artery, no central or segmental pulmonary embolism. Multifocal airspace opacities in both lower lungs, consistent with multifocal pneumonia. Electronically Signed   By: Jonna Clark M.D.   On: 07/01/2020 23:06   DG Chest Portable 1 View  Result Date: 07/01/2020 CLINICAL DATA:  Short of breath EXAM: PORTABLE CHEST 1 VIEW COMPARISON:  March 09, 2006 FINDINGS: The cardiomediastinal silhouette is normal in contour. No pleural effusion. No pneumothorax. Bibasilar peripheral predominant hazy opacities. Visualized abdomen is unremarkable. No acute osseous abnormality noted. IMPRESSION: Bibasilar peripheral predominant hazy opacities are suspicious for COVID-19 pneumonia. Electronically Signed   By: Meda Klinefelter MD   On: 07/01/2020 13:14

## 2020-07-05 LAB — COMPREHENSIVE METABOLIC PANEL
ALT: 326 U/L — ABNORMAL HIGH (ref 0–44)
AST: 70 U/L — ABNORMAL HIGH (ref 15–41)
Albumin: 3.4 g/dL — ABNORMAL LOW (ref 3.5–5.0)
Alkaline Phosphatase: 68 U/L (ref 38–126)
Anion gap: 12 (ref 5–15)
BUN: 17 mg/dL (ref 6–20)
CO2: 23 mmol/L (ref 22–32)
Calcium: 9.1 mg/dL (ref 8.9–10.3)
Chloride: 105 mmol/L (ref 98–111)
Creatinine, Ser: 0.89 mg/dL (ref 0.61–1.24)
GFR calc Af Amer: >60 mL/min (ref >60)
GFR calc non Af Amer: >60 mL/min (ref >60)
Glucose, Bld: 144 mg/dL — ABNORMAL HIGH (ref 70–99)
Potassium: 4.9 mmol/L (ref 3.5–5.1)
Sodium: 140 mmol/L (ref 135–145)
Total Bilirubin: 0.7 mg/dL (ref 0.3–1.2)
Total Protein: 6.4 g/dL — ABNORMAL LOW (ref 6.5–8.1)

## 2020-07-05 LAB — CBC WITH DIFFERENTIAL/PLATELET
Abs Immature Granulocytes: 0.17 10*3/uL — ABNORMAL HIGH (ref 0.00–0.07)
Basophils Absolute: 0 10*3/uL (ref 0.0–0.1)
Basophils Relative: 0 %
Eosinophils Absolute: 0 10*3/uL (ref 0.0–0.5)
Eosinophils Relative: 0 %
HCT: 40.8 % (ref 39.0–52.0)
Hemoglobin: 14.1 g/dL (ref 13.0–17.0)
Immature Granulocytes: 1 %
Lymphocytes Relative: 12 %
Lymphs Abs: 1.4 10*3/uL (ref 0.7–4.0)
MCH: 30.8 pg (ref 26.0–34.0)
MCHC: 34.6 g/dL (ref 30.0–36.0)
MCV: 89.1 fL (ref 80.0–100.0)
Monocytes Absolute: 0.9 10*3/uL (ref 0.1–1.0)
Monocytes Relative: 7 %
Neutro Abs: 9.9 10*3/uL — ABNORMAL HIGH (ref 1.7–7.7)
Neutrophils Relative %: 80 %
Platelets: 366 10*3/uL (ref 150–400)
RBC: 4.58 MIL/uL (ref 4.22–5.81)
RDW: 11.5 % (ref 11.5–15.5)
WBC: 12.4 10*3/uL — ABNORMAL HIGH (ref 4.0–10.5)
nRBC: 0 % (ref 0.0–0.2)

## 2020-07-05 LAB — C-REACTIVE PROTEIN: CRP: 0.9 mg/dL (ref <1.0)

## 2020-07-05 MED ORDER — PREDNISONE 10 MG PO TABS
ORAL_TABLET | ORAL | 0 refills | Status: DC
Start: 2020-07-05 — End: 2022-04-23

## 2020-07-05 MED ORDER — GUAIFENESIN-DM 100-10 MG/5ML PO SYRP: 10.0000 mL | ORAL_SOLUTION | ORAL | 0 refills | Status: DC | PRN

## 2020-07-05 NOTE — Discharge Summary (Signed)
PATIENT DETAILS Name: Marvin Brady Age: 34 y.o. Sex: male Date of Birth: Nov 13, 1986 MRN: [id number]. Admitting Physician: Briscoe Deutscher, MD WUJ:[id number], No Pcp Per  Admit Date: 07/01/2020 Discharge date: 07/05/2020  Recommendations for Outpatient Follow-up:  1. Follow up with PCP in 1-2 weeks 2. Please obtain CMP/CBC in one week 3. Repeat Chest Xray in 4-6 week  Admitted From:  Home  Disposition: Home   Home Health: No  Equipment/Devices: None  Discharge Condition: Stable  CODE STATUS: FULL CODE  Diet recommendation:  Diet Order            Diet general           Diet Heart Room service appropriate? Yes; Fluid consistency: Thin  Diet effective now                  Brief Narrative: Patient is a 34 y.o. male with no significant past medical history-diagnosed with COVID-19 on 7/20 at a local urgent care-presented to the hospital with lightheadedness, cough along with fevers-found to have acute hypoxic respiratory failure secondary to COVID-19 pneumonia.  See below for further details.    Significant Events: 7/20>> diagnosed with COVID-19 at a local urgent care per patient report 7/26>> admit to [facility] for COVID-19 pneumonia with hypoxia  Significant studies: 7/26>> chest x-ray: Bibasilar peripheral hazy opacities-suspicious for COVID-19 pneumonia 7/26>> CTA chest: No PE-multifocal airspace disease in both lungs  COVID-19 medications: Steroids: 7/26>> Remdesivir: 7/26>>7/30  Antibiotics: None  Microbiology data: 7/26>> blood cultures: 1/2-gram-positive cocci in clusters  Procedures: None  Consults: None  Brief Hospital Course: Sepsis with acute Hypoxic Resp Failure due to Covid 19 Viral pneumonia: Unvaccinated-admitted with sepsis physiology and mild hypoxemia secondary to COVID-19 pneumonia.  Managed with steroids and remdesivir.  Significantly better-has been on room air for the past several days-we will complete remdesivir today-we  will continue with steroids for total of 10 days.  Since stable-not on oxygen-clinically improved-stable for discharge.    COVID-19 Labs:  Recent Labs    07/03/20 0433 07/04/20 0412 07/05/20 0610  DDIMER 0.38 <0.27  --   FERRITIN 533* 620*  --   CRP 4.4* 1.6* 0.9    Lab Results  Component Value Date   SARSCOV2NAA POSITIVE (A) 07/01/2020     Coagulase negative bacteremia: 1/2 blood cultures positive coagulase-negative staph-likely contaminant-no further work-up required.  Transaminitis: Likely secondary to COVID-19-stable for follow-up for now.  Discharge Diagnoses:  Principal Problem:   Viral pneumonia Active Problems:   Chest pain   Sepsis due to pneumonia [facility])   Discharge Instructions:    Person Under Monitoring Name: Marvin Brady  Location: [address] [place] [address]   Infection Prevention Recommendations for Individuals Confirmed to have, or Being Evaluated for, 2019 Novel Coronavirus (COVID-19) Infection Who Receive Care at Home  Individuals who are confirmed to have, or are being evaluated for, COVID-19 should follow the prevention steps below until a healthcare provider or local or state health department says they can return to normal activities.  Stay home except to get medical care You should restrict activities outside your home, except for getting medical care. Do not go to work, school, or public areas, and do not use public transportation or taxis.  Call ahead before visiting your doctor Before your medical appointment, call the healthcare provider and tell them that you have, or are being evaluated for, COVID-19 infection. This will help the healthcare provider's office take steps to keep other people from getting  infected. Ask your healthcare provider to call the local or state health department.  Monitor your symptoms Seek prompt medical attention if your illness is worsening (e.g., difficulty breathing). Before going to your  medical appointment, call the healthcare provider and tell them that you have, or are being evaluated for, COVID-19 infection. Ask your healthcare provider to call the local or state health department.  Wear a facemask You should wear a facemask that covers your nose and mouth when you are in the same room with other people and when you visit a healthcare provider. People who live with or visit you should also wear a facemask while they are in the same room with you.  Separate yourself from other people in your home As much as possible, you should stay in a different room from other people in your home. Also, you should use a separate bathroom, if available.  Avoid sharing household items You should not share dishes, drinking glasses, cups, eating utensils, towels, bedding, or other items with other people in your home. After using these items, you should wash them thoroughly with soap and water.  Cover your coughs and sneezes Cover your mouth and nose with a tissue when you cough or sneeze, or you can cough or sneeze into your sleeve. Throw used tissues in a lined trash can, and immediately wash your hands with soap and water for at least 20 seconds or use an alcohol-based hand rub.  Wash your [employer] your hands often and thoroughly with soap and water for at least 20 seconds. You can use an alcohol-based hand sanitizer if soap and water are not available and if your hands are not visibly dirty. Avoid touching your eyes, nose, and mouth with unwashed hands.   Prevention Steps for Caregivers and Household Members of Individuals Confirmed to have, or Being Evaluated for, COVID-19 Infection Being Cared for in the Home  If you live with, or provide care at home for, a person confirmed to have, or being evaluated for, COVID-19 infection please follow these guidelines to prevent infection:  Follow healthcare provider's instructions Make sure that you understand and can help the  patient follow any healthcare provider instructions for all care.  Provide for the patient's basic needs You should help the patient with basic needs in the home and provide support for getting groceries, prescriptions, and other personal needs.  Monitor the patient's symptoms If they are getting sicker, call his or her medical provider and tell them that the patient has, or is being evaluated for, COVID-19 infection. This will help the healthcare provider's office take steps to keep other people from getting infected. Ask the healthcare provider to call the local or state health department.  Limit the number of people who have contact with the patient  If possible, have only one caregiver for the patient.  Other household members should stay in another home or place of residence. If this is not possible, they should stay  in another room, or be separated from the patient as much as possible. Use a separate bathroom, if available.  Restrict visitors who do not have an essential need to be in the home.  Keep older adults, very young children, and other sick people away from the patient Keep older adults, very young children, and those who have compromised immune systems or chronic health conditions away from the patient. This includes people with chronic heart, lung, or kidney conditions, diabetes, and cancer.  Ensure good ventilation Make sure that shared  spaces in the home have good air flow, such as from an air conditioner or an opened window, weather permitting.  Wash your hands often  Wash your hands often and thoroughly with soap and water for at least 20 seconds. You can use an alcohol based hand sanitizer if soap and water are not available and if your hands are not visibly dirty.  Avoid touching your eyes, nose, and mouth with unwashed hands.  Use disposable paper towels to dry your hands. If not available, use dedicated cloth towels and replace them when they become  wet.  Wear a facemask and gloves  Wear a disposable facemask at all times in the room and gloves when you touch or have contact with the patient's blood, body fluids, and/or secretions or excretions, such as sweat, saliva, sputum, nasal mucus, vomit, urine, or feces.  Ensure the mask fits over your nose and mouth tightly, and do not touch it during use.  Throw out disposable facemasks and gloves after using them. Do not reuse.  Wash your hands immediately after removing your facemask and gloves.  If your personal clothing becomes contaminated, carefully remove clothing and launder. Wash your hands after handling contaminated clothing.  Place all used disposable facemasks, gloves, and other waste in a lined container before disposing them with other household waste.  Remove gloves and wash your hands immediately after handling these items.  Do not share dishes, glasses, or other household items with the patient  Avoid sharing household items. You should not share dishes, drinking glasses, cups, eating utensils, towels, bedding, or other items with a patient who is confirmed to have, or being evaluated for, COVID-19 infection.  After the person uses these items, you should wash them thoroughly with soap and water.  Wash laundry thoroughly  Immediately remove and wash clothes or bedding that have blood, body fluids, and/or secretions or excretions, such as sweat, saliva, sputum, nasal mucus, vomit, urine, or feces, on them.  Wear gloves when handling laundry from the patient.  Read and follow directions on labels of laundry or clothing items and detergent. In general, wash and dry with the warmest temperatures recommended on the label.  Clean all areas the individual has used often  Clean all touchable surfaces, such as counters, tabletops, doorknobs, bathroom fixtures, toilets, phones, keyboards, tablets, and bedside tables, every day. Also, clean any surfaces that may have blood, body  fluids, and/or secretions or excretions on them.  Wear gloves when cleaning surfaces the patient has come in contact with.  Use a diluted bleach solution (e.g., dilute bleach with 1 part bleach and 10 parts water) or a household disinfectant with a label that says EPA-registered for coronaviruses. To make a bleach solution at home, add 1 tablespoon of bleach to 1 quart (4 cups) of water. For a larger supply, add  cup of bleach to 1 gallon (16 cups) of water.  Read labels of cleaning products and follow recommendations provided on product labels. Labels contain instructions for safe and effective use of the cleaning product including precautions you should take when applying the product, such as wearing gloves or eye protection and making sure you have good ventilation during use of the product.  Remove gloves and wash hands immediately after cleaning.  Monitor yourself for signs and symptoms of illness Caregivers and household members are considered close contacts, should monitor their health, and will be asked to limit movement outside of the home to the extent possible. Follow the monitoring steps for close  contacts listed on the symptom monitoring form.   ? If you have additional questions, contact your local health department or call the epidemiologist on call at [phone] (available 24/7). ? This guidance is subject to change. For the most up-to-date guidance from [facility], please refer to their website: [employer]    Activity:  As tolerated   Discharge Instructions    Call MD for:  difficulty breathing, headache or visual disturbances   Complete by: As directed    Diet general   Complete by: As directed    Discharge instructions   Complete by: As directed    Follow with Primary MD  Patient in 1-2 weeks  Please get a complete blood count and chemistry panel checked by your Primary MD at your next visit, and again as  instructed by your Primary MD.  Get Medicines reviewed and adjusted: Please take all your medications with you for your next visit with your Primary MD  Laboratory/radiological data: Please request your Primary MD to go over all hospital tests and procedure/radiological results at the follow up, please ask your Primary MD to get all Hospital records sent to his/her office.  In some cases, they will be blood work, cultures and biopsy results pending at the time of your discharge. Please request that your primary care M.D. follows up on these results.  Also Note the following: If you experience worsening of your admission symptoms, develop shortness of breath, life threatening emergency, suicidal or homicidal thoughts you must seek medical attention immediately by calling 911 or calling your MD immediately  if symptoms less severe.  You must read complete instructions/literature along with all the possible adverse reactions/side effects for all the Medicines you take and that have been prescribed to you. Take any new Medicines after you have completely understood and accpet all the possible adverse reactions/side effects.   Do not drive when taking Pain medications or sleeping medications (Benzodaizepines)  Do not take more than prescribed Pain, Sleep and Anxiety Medications. It is not advisable to combine anxiety,sleep and pain medications without talking with your primary care practitioner  Special Instructions: If you have smoked or chewed Tobacco  in the last 2 yrs please stop smoking, stop any regular Alcohol  and or any Recreational drug use.  Wear Seat belts while driving.  Please note: You were cared for by a hospitalist during your hospital stay. Once you are discharged, your primary care physician will handle any further medical issues. Please note that NO REFILLS for any discharge medications will be authorized once you are discharged, as it is imperative that you return to your  primary care physician (or establish a relationship with a primary care physician if you do not have one) for your post hospital discharge needs so that they can reassess your need for medications and monitor your lab values.   1.  3 weeks of isolation from 06/25/2020  2.  Your liver enzymes are elevated-please ask your primary care MD to recheck LFTs in 1 week to ensure they have normalized.   Increase activity slowly   Complete by: As directed      Allergies as of 07/05/2020   No Known Allergies     Medication List    TAKE these medications   guaiFENesin-dextromethorphan 100-10 MG/5ML syrup Commonly known as: ROBITUSSIN DM Take 10 mLs by mouth every 4 (four) hours as needed for cough.   predniSONE 10 MG tablet Commonly known as: DELTASONE Take 40 mg daily for 1 day, 30  mg daily for 1 day, 20 mg daily for 1 days,10 mg daily for 1 day, then stop       Follow-up Information    Primary care MD. Schedule an appointment as soon as possible for a visit in 1 week(s).              No Known Allergies     Other Procedures/Studies: CT ANGIO CHEST PE W OR WO CONTRAST  Result Date: 07/01/2020 CLINICAL DATA:  Chest pain and shortness of breath COVID EXAM: CT ANGIOGRAPHY CHEST WITH CONTRAST TECHNIQUE: Multidetector CT imaging of the chest was performed using the standard protocol during bolus administration of intravenous contrast. Multiplanar CT image reconstructions and MIPs were obtained to evaluate the vascular anatomy. CONTRAST:  52mL OMNIPAQUE IOHEXOL 350 MG/ML SOLN COMPARISON:  None. FINDINGS: Cardiovascular: There is slightly suboptimal opacification of the main pulmonary artery. No central or segmental pulmonary embolism is seen. Limited evaluation of the subsegmental arterial branches. The heart is normal in size. No pericardial effusion or thickening. No evidence right heart strain. There is normal three-vessel brachiocephalic anatomy without proximal stenosis. The thoracic aorta  is normal in appearance. Mediastinum/Nodes: No hilar, mediastinal, or axillary adenopathy. Thyroid gland, trachea, and esophagus demonstrate no significant findings. Lungs/Pleura: Patchy rounded areas consolidation are seen at the posterior bilateral lung bases. There air bronchogram seen at the right lung base. No effusion is seen. Upper Abdomen: No acute abnormalities present in the visualized portions of the upper abdomen. Musculoskeletal: No chest wall abnormality. No acute or significant osseous findings. Review of the MIP images confirms the above findings. IMPRESSION: Slightly suboptimal opacification of the main pulmonary artery, no central or segmental pulmonary embolism. Multifocal airspace opacities in both lower lungs, consistent with multifocal pneumonia. Electronically Signed   By: Jonna Clark M.D.   On: 07/01/2020 23:06   DG Chest Portable 1 View  Result Date: 07/01/2020 CLINICAL DATA:  Short of breath EXAM: PORTABLE CHEST 1 VIEW COMPARISON:  March 09, 2006 FINDINGS: The cardiomediastinal silhouette is normal in contour. No pleural effusion. No pneumothorax. Bibasilar peripheral predominant hazy opacities. Visualized abdomen is unremarkable. No acute osseous abnormality noted. IMPRESSION: Bibasilar peripheral predominant hazy opacities are suspicious for COVID-19 pneumonia. Electronically Signed   By: Meda Klinefelter MD   On: 07/01/2020 13:14     TODAY-DAY OF DISCHARGE:  Subjective:   Marvin Brady today has no headache,no chest abdominal pain,no new weakness tingling or numbness, feels much better wants to go home today.   Objective:   Blood pressure 105/66, pulse 60, temperature 97.8 F (36.6 C), temperature source Oral, resp. rate 18, height 6' (1.829 m), weight 78.9 kg, SpO2 98 %.  Intake/Output Summary (Last 24 hours) at 07/05/2020 0944 Last data filed at 07/05/2020 0439 Gross per 24 hour  Intake 240 ml  Output --  Net 240 ml   Filed Weights   07/02/20 0315  Weight:  78.9 kg    Exam: Awake Alert, Oriented *3, No new F.N deficits, Normal affect [place].AT,PERRAL Supple Neck,No JVD, No cervical lymphadenopathy appriciated.  Symmetrical Chest wall movement, Good air movement bilaterally, CTAB RRR,No Gallops,Rubs or new Murmurs, No Parasternal Heave +ve B.Sounds, Abd Soft, Non tender, No organomegaly appriciated, No rebound -guarding or rigidity. No Cyanosis, Clubbing or edema, No new Rash or bruise   PERTINENT RADIOLOGIC STUDIES: CT ANGIO CHEST PE W OR WO CONTRAST  Result Date: 07/01/2020 CLINICAL DATA:  Chest pain and shortness of breath COVID EXAM: CT ANGIOGRAPHY CHEST WITH CONTRAST TECHNIQUE: Multidetector CT  imaging of the chest was performed using the standard protocol during bolus administration of intravenous contrast. Multiplanar CT image reconstructions and MIPs were obtained to evaluate the vascular anatomy. CONTRAST:  75mL OMNIPAQUE IOHEXOL 350 MG/ML SOLN COMPARISON:  None. FINDINGS: Cardiovascular: There is slightly suboptimal opacification of the main pulmonary artery. No central or segmental pulmonary embolism is seen. Limited evaluation of the subsegmental arterial branches. The heart is normal in size. No pericardial effusion or thickening. No evidence right heart strain. There is normal three-vessel brachiocephalic anatomy without proximal stenosis. The thoracic aorta is normal in appearance. Mediastinum/Nodes: No hilar, mediastinal, or axillary adenopathy. Thyroid gland, trachea, and esophagus demonstrate no significant findings. Lungs/Pleura: Patchy rounded areas consolidation are seen at the posterior bilateral lung bases. There air bronchogram seen at the right lung base. No effusion is seen. Upper Abdomen: No acute abnormalities present in the visualized portions of the upper abdomen. Musculoskeletal: No chest wall abnormality. No acute or significant osseous findings. Review of the MIP images confirms the above findings. IMPRESSION: Slightly  suboptimal opacification of the main pulmonary artery, no central or segmental pulmonary embolism. Multifocal airspace opacities in both lower lungs, consistent with multifocal pneumonia. Electronically Signed   By: Jonna Clark M.D.   On: 07/01/2020 23:06   DG Chest Portable 1 View  Result Date: 07/01/2020 CLINICAL DATA:  Short of breath EXAM: PORTABLE CHEST 1 VIEW COMPARISON:  March 09, 2006 FINDINGS: The cardiomediastinal silhouette is normal in contour. No pleural effusion. No pneumothorax. Bibasilar peripheral predominant hazy opacities. Visualized abdomen is unremarkable. No acute osseous abnormality noted. IMPRESSION: Bibasilar peripheral predominant hazy opacities are suspicious for COVID-19 pneumonia. Electronically Signed   By: Meda Klinefelter MD   On: 07/01/2020 13:14     PERTINENT LAB RESULTS: CBC: Recent Labs    07/04/20 0412 07/05/20 0610  WBC 11.0* 12.4*  HGB 14.1 14.1  HCT 39.3 40.8  PLT 332 366   CMET CMP     Component Value Date/Time   NA 140 07/05/2020 0610   K 4.9 07/05/2020 0610   CL 105 07/05/2020 0610   CO2 23 07/05/2020 0610   GLUCOSE 144 (H) 07/05/2020 0610   BUN 17 07/05/2020 0610   CREATININE 0.89 07/05/2020 0610   CALCIUM 9.1 07/05/2020 0610   PROT 6.4 (L) 07/05/2020 0610   ALBUMIN 3.4 (L) 07/05/2020 0610   AST 70 (H) 07/05/2020 0610   ALT 326 (H) 07/05/2020 0610   ALKPHOS 68 07/05/2020 0610   BILITOT 0.7 07/05/2020 0610   GFRNONAA >60 07/05/2020 0610   GFRAA >60 07/05/2020 0610    GFR Estimated Creatinine Clearance: 132 mL/min (by C-G formula based on SCr of 0.89 mg/dL). No results for input(s): LIPASE, AMYLASE in the last 72 hours. No results for input(s): CKTOTAL, CKMB, CKMBINDEX, TROPONINI in the last 72 hours. Invalid input(s): POCBNP Recent Labs    07/03/20 0433 07/04/20 0412  DDIMER 0.38 <0.27   No results for input(s): HGBA1C in the last 72 hours. No results for input(s): CHOL, HDL, LDLCALC, TRIG, CHOLHDL, LDLDIRECT in the  last 72 hours. No results for input(s): TSH, T4TOTAL, T3FREE, THYROIDAB in the last 72 hours.  Invalid input(s): FREET3 Recent Labs    07/03/20 0433 07/04/20 0412  FERRITIN 533* 620*   Coags: No results for input(s): INR in the last 72 hours.  Invalid input(s): PT Microbiology: Recent Results (from the past 240 hour(s))  Culture, blood (Routine x 2)     Status: None (Preliminary result)   Collection Time: 07/01/20  1:06 PM   Specimen: BLOOD  Result Value Ref Range Status   Specimen Description BLOOD RIGHT ANTECUBITAL  Final   Special Requests   Final    BOTTLES DRAWN AEROBIC AND ANAEROBIC Blood Culture adequate volume   Culture   Final    NO GROWTH 3 DAYS Performed at [facility], [address] [place], [place] [address]    Report Status PENDING  Incomplete  Culture, blood (Routine x 2)     Status: Abnormal   Collection Time: 07/01/20  7:42 PM   Specimen: BLOOD  Result Value Ref Range Status   Specimen Description BLOOD LEFT ANTECUBITAL  Final   Special Requests   Final    BOTTLES DRAWN AEROBIC AND ANAEROBIC Blood Culture adequate volume   Culture  Setup Time   Final    GRAM POSITIVE COCCI IN CLUSTERS Organism ID to follow CRITICAL RESULT CALLED TO, READ BACK BY AND VERIFIED WITH: A WOLFE PHARMD [id number] 07/02/20 A BROWNING    Culture (A)  Final    STAPHYLOCOCCUS SPECIES (COAGULASE NEGATIVE) THE SIGNIFICANCE OF ISOLATING THIS ORGANISM FROM A SINGLE SET OF BLOOD CULTURES WHEN MULTIPLE SETS ARE DRAWN IS UNCERTAIN. PLEASE NOTIFY THE MICROBIOLOGY DEPARTMENT WITHIN ONE WEEK IF SPECIATION AND SENSITIVITIES ARE REQUIRED. Performed at [facility], [address], [place], [place] [address]    Report Status 07/04/2020 FINAL  Final  Blood Culture ID Panel (Reflexed)     Status: Abnormal   Collection Time: 07/01/20  7:42 PM  Result Value Ref Range Status   Enterococcus species NOT DETECTED NOT DETECTED Final   Listeria monocytogenes NOT DETECTED NOT DETECTED Final    Staphylococcus species DETECTED (A) NOT DETECTED Final    Comment: Methicillin (oxacillin) susceptible coagulase negative staphylococcus. Possible blood culture contaminant (unless isolated from more than one blood culture draw or clinical case suggests pathogenicity). No antibiotic treatment is indicated for blood  culture contaminants. CRITICAL RESULT CALLED TO, READ BACK BY AND VERIFIED WITH: A WOLFE PHARMD [id number] 07/02/20 A BROWNING    Staphylococcus aureus (BCID) NOT DETECTED NOT DETECTED Final   Methicillin resistance NOT DETECTED NOT DETECTED Final   Streptococcus species NOT DETECTED NOT DETECTED Final   Streptococcus agalactiae NOT DETECTED NOT DETECTED Final   Streptococcus pneumoniae NOT DETECTED NOT DETECTED Final   Streptococcus pyogenes NOT DETECTED NOT DETECTED Final   Acinetobacter baumannii NOT DETECTED NOT DETECTED Final   Enterobacteriaceae species NOT DETECTED NOT DETECTED Final   Enterobacter cloacae complex NOT DETECTED NOT DETECTED Final   Escherichia coli NOT DETECTED NOT DETECTED Final   Klebsiella oxytoca NOT DETECTED NOT DETECTED Final   Klebsiella pneumoniae NOT DETECTED NOT DETECTED Final   Proteus species NOT DETECTED NOT DETECTED Final   Serratia marcescens NOT DETECTED NOT DETECTED Final   Haemophilus influenzae NOT DETECTED NOT DETECTED Final   Neisseria meningitidis NOT DETECTED NOT DETECTED Final   Pseudomonas aeruginosa NOT DETECTED NOT DETECTED Final   Candida albicans NOT DETECTED NOT DETECTED Final   Candida glabrata NOT DETECTED NOT DETECTED Final   Candida krusei NOT DETECTED NOT DETECTED Final   Candida parapsilosis NOT DETECTED NOT DETECTED Final   Candida tropicalis NOT DETECTED NOT DETECTED Final    Comment: Performed at [facility], [address] [place], [place] [address]  SARS Coronavirus 2 by RT PCR (hospital order, performed in [facility] hospital lab) Nasopharyngeal Nasopharyngeal Swab     Status: Abnormal   Collection Time:  07/01/20  9:29 PM  Specimen: Nasopharyngeal Swab  Result Value Ref Range Status   SARS Coronavirus 2 POSITIVE (A) NEGATIVE Final    Comment: RESULT CALLED TO, READ BACK BY AND VERIFIED WITH: C HARRIS RN [id number] 07/01/20 A BROWNING (NOTE) SARS-CoV-2 target nucleic acids are DETECTED  SARS-CoV-2 RNA is generally detectable in upper respiratory specimens  during the acute phase of infection.  Positive results are indicative  of the presence of the identified virus, but do not rule out bacterial infection or co-infection with other pathogens not detected by the test.  Clinical correlation with patient history and  other diagnostic information is necessary to determine patient infection status.  The expected result is negative.  Fact Sheet for Patients:   BoilerBrush.com.cy   Fact Sheet for Healthcare Providers:   https://pope.com/    This test is not yet approved or cleared by the [place] FDA and  has been authorized for detection and/or diagnosis of SARS-CoV-2 by FDA under an Emergency Use Authorization (EUA).  This EUA will remain in effect (meaning this t est can be used) for the duration of  the COVID-19 declaration under Section 564(b)(1) of the Act, 21 U.S.C. section 360-bbb-3(b)(1), unless the authorization is terminated or revoked sooner.  Performed at [facility], [address], [place], [place] [address]     FURTHER DISCHARGE INSTRUCTIONS:  Get Medicines reviewed and adjusted: Please take all your medications with you for your next visit with your Primary MD  Laboratory/radiological data: Please request your Primary MD to go over all hospital tests and procedure/radiological results at the follow up, please ask your Primary MD to get all Hospital records sent to his/her office.  In some cases, they will be blood work, cultures and biopsy results pending at the time of your discharge. Please request that your  primary care M.D. goes through all the records of your hospital data and follows up on these results.  Also Note the following: If you experience worsening of your admission symptoms, develop shortness of breath, life threatening emergency, suicidal or homicidal thoughts you must seek medical attention immediately by calling 911 or calling your MD immediately  if symptoms less severe.  You must read complete instructions/literature along with all the possible adverse reactions/side effects for all the Medicines you take and that have been prescribed to you. Take any new Medicines after you have completely understood and accpet all the possible adverse reactions/side effects.   Do not drive when taking Pain medications or sleeping medications (Benzodaizepines)  Do not take more than prescribed Pain, Sleep and Anxiety Medications. It is not advisable to combine anxiety,sleep and pain medications without talking with your primary care practitioner  Special Instructions: If you have smoked or chewed Tobacco  in the last 2 yrs please stop smoking, stop any regular Alcohol  and or any Recreational drug use.  Wear Seat belts while driving.  Please note: You were cared for by a hospitalist during your hospital stay. Once you are discharged, your primary care physician will handle any further medical issues. Please note that NO REFILLS for any discharge medications will be authorized once you are discharged, as it is imperative that you return to your primary care physician (or establish a relationship with a primary care physician if you do not have one) for your post hospital discharge needs so that they can reassess your need for medications and monitor your lab values.  Total Time spent coordinating discharge including counseling, education and face to face time equals  35 minutes.  SignedJeoffrey Massed: Gwendolyn Mclees 07/05/2020 9:44 AM

## 2020-07-05 NOTE — Discharge Instructions (Signed)
Person Under Monitoring Name: Marvin Brady  Location: [address] [place] [address]   Infection Prevention Recommendations for Individuals Confirmed to have, or Being Evaluated for, 2019 Novel Coronavirus (COVID-19) Infection Who Receive Care at Home  Individuals who are confirmed to have, or are being evaluated for, COVID-19 should follow the prevention steps below until a healthcare provider or local or state health department says they can return to normal activities.  Stay home except to get medical care You should restrict activities outside your home, except for getting medical care. Do not go to work, school, or public areas, and do not use public transportation or taxis.  Call ahead before visiting your doctor Before your medical appointment, call the healthcare provider and tell them that you have, or are being evaluated for, COVID-19 infection. This will help the healthcare providers office take steps to keep other people from getting infected. Ask your healthcare provider to call the local or state health department.  Monitor your symptoms Seek prompt medical attention if your illness is worsening (e.g., difficulty breathing). Before going to your medical appointment, call the healthcare provider and tell them that you have, or are being evaluated for, COVID-19 infection. Ask your healthcare provider to call the local or state health department.  Wear a facemask You should wear a facemask that covers your nose and mouth when you are in the same room with other people and when you visit a healthcare provider. People who live with or visit you should also wear a facemask while they are in the same room with you.  Separate yourself from other people in your home As much as possible, you should stay in a different room from other people in your home. Also, you should use a separate bathroom, if available.  Avoid sharing household items You should not share  dishes, drinking glasses, cups, eating utensils, towels, bedding, or other items with other people in your home. After using these items, you should wash them thoroughly with soap and water.  Cover your coughs and sneezes Cover your mouth and nose with a tissue when you cough or sneeze, or you can cough or sneeze into your sleeve. Throw used tissues in a lined trash can, and immediately wash your hands with soap and water for at least 20 seconds or use an alcohol-based hand rub.  Wash your [employer] your hands often and thoroughly with soap and water for at least 20 seconds. You can use an alcohol-based hand sanitizer if soap and water are not available and if your hands are not visibly dirty. Avoid touching your eyes, nose, and mouth with unwashed hands.   Prevention Steps for Caregivers and Household Members of Individuals Confirmed to have, or Being Evaluated for, COVID-19 Infection Being Cared for in the Home  If you live with, or provide care at home for, a person confirmed to have, or being evaluated for, COVID-19 infection please follow these guidelines to prevent infection:  Follow healthcare providers instructions Make sure that you understand and can help the patient follow any healthcare provider instructions for all care.  Provide for the patients basic needs You should help the patient with basic needs in the home and provide support for getting groceries, prescriptions, and other personal needs.  Monitor the patients symptoms If they are getting sicker, call his or her medical provider and tell them that the patient has, or is being evaluated for, COVID-19 infection. This will help the healthcare providers office  take steps to keep other people from getting infected. Ask the healthcare provider to call the local or state health department.  Limit the number of people who have contact with the patient  If possible, have only one caregiver for the patient.  Other  household members should stay in another home or place of residence. If this is not possible, they should stay  in another room, or be separated from the patient as much as possible. Use a separate bathroom, if available.  Restrict visitors who do not have an essential need to be in the home.  Keep older adults, very young children, and other sick people away from the patient Keep older adults, very young children, and those who have compromised immune systems or chronic health conditions away from the patient. This includes people with chronic heart, lung, or kidney conditions, diabetes, and cancer.  Ensure good ventilation Make sure that shared spaces in the home have good air flow, such as from an air conditioner or an opened window, weather permitting.  Wash your hands often  Wash your hands often and thoroughly with soap and water for at least 20 seconds. You can use an alcohol based hand sanitizer if soap and water are not available and if your hands are not visibly dirty.  Avoid touching your eyes, nose, and mouth with unwashed hands.  Use disposable paper towels to dry your hands. If not available, use dedicated cloth towels and replace them when they become wet.  Wear a facemask and gloves  Wear a disposable facemask at all times in the room and gloves when you touch or have contact with the patients blood, body fluids, and/or secretions or excretions, such as sweat, saliva, sputum, nasal mucus, vomit, urine, or feces.  Ensure the mask fits over your nose and mouth tightly, and do not touch it during use.  Throw out disposable facemasks and gloves after using them. Do not reuse.  Wash your hands immediately after removing your facemask and gloves.  If your personal clothing becomes contaminated, carefully remove clothing and launder. Wash your hands after handling contaminated clothing.  Place all used disposable facemasks, gloves, and other waste in a lined container before  disposing them with other household waste.  Remove gloves and wash your hands immediately after handling these items.  Do not share dishes, glasses, or other household items with the patient  Avoid sharing household items. You should not share dishes, drinking glasses, cups, eating utensils, towels, bedding, or other items with a patient who is confirmed to have, or being evaluated for, COVID-19 infection.  After the person uses these items, you should wash them thoroughly with soap and water.  Wash laundry thoroughly  Immediately remove and wash clothes or bedding that have blood, body fluids, and/or secretions or excretions, such as sweat, saliva, sputum, nasal mucus, vomit, urine, or feces, on them.  Wear gloves when handling laundry from the patient.  Read and follow directions on labels of laundry or clothing items and detergent. In general, wash and dry with the warmest temperatures recommended on the label.  Clean all areas the individual has used often  Clean all touchable surfaces, such as counters, tabletops, doorknobs, bathroom fixtures, toilets, phones, keyboards, tablets, and bedside tables, every day. Also, clean any surfaces that may have blood, body fluids, and/or secretions or excretions on them.  Wear gloves when cleaning surfaces the patient has come in contact with.  Use a diluted bleach solution (e.g., dilute bleach with 1 part  bleach and 10 parts water) or a household disinfectant with a label that says EPA-registered for coronaviruses. To make a bleach solution at home, add 1 tablespoon of bleach to 1 quart (4 cups) of water. For a larger supply, add  cup of bleach to 1 gallon (16 cups) of water.  Read labels of cleaning products and follow recommendations provided on product labels. Labels contain instructions for safe and effective use of the cleaning product including precautions you should take when applying the product, such as wearing gloves or eye protection  and making sure you have good ventilation during use of the product.  Remove gloves and wash hands immediately after cleaning.  Monitor yourself for signs and symptoms of illness Caregivers and household members are considered close contacts, should monitor their health, and will be asked to limit movement outside of the home to the extent possible. Follow the monitoring steps for close contacts listed on the symptom monitoring form.   ? If you have additional questions, contact your local health department or call the epidemiologist on call at [phone] (available 24/7). ? This guidance is subject to change. For the most up-to-date guidance from [facility], please refer to their website: [email or web address].pl

## 2020-07-06 LAB — CULTURE, BLOOD (ROUTINE X 2)
Culture: NO GROWTH
Special Requests: ADEQUATE

## 2022-04-23 ENCOUNTER — Ambulatory Visit (INDEPENDENT_AMBULATORY_CARE_PROVIDER_SITE_OTHER): Payer: BC Managed Care – PPO | Admitting: Nurse Practitioner

## 2022-04-23 ENCOUNTER — Encounter: Payer: Self-pay | Admitting: Nurse Practitioner

## 2022-04-23 VITALS — BP 122/82 | HR 92 | Temp 97.9°F | Resp 12 | Ht 72.0 in | Wt 196.5 lb

## 2022-04-23 DIAGNOSIS — E663 Overweight: Secondary | ICD-10-CM

## 2022-04-23 DIAGNOSIS — Z0000 Encounter for general adult medical examination without abnormal findings: Secondary | ICD-10-CM | POA: Diagnosis not present

## 2022-04-23 DIAGNOSIS — M2550 Pain in unspecified joint: Secondary | ICD-10-CM

## 2022-04-23 DIAGNOSIS — H6121 Impacted cerumen, right ear: Secondary | ICD-10-CM | POA: Insufficient documentation

## 2022-04-23 NOTE — Patient Instructions (Signed)
Nice to see you today ?I will be in touch with the lab results ?Follow up with me in 1 year, sooner if you need me ?

## 2022-04-23 NOTE — Progress Notes (Signed)
Established Patient Office Visit  Subjective   Patient ID: Marvin Brady, male    DOB: [birth year]  Age: 36 y.o. MRN: [id number]  Chief Complaint  Patient presents with   Establish Care    No previous PCP in years   Joint Pain    In all the joints, x years. Has taking Ibuprofen and Magnesium.    HPI for complete physical and follow up of chronic conditions.  Immunizations: -Tetanus: this  -Influenza: out of season -Covid-19: refused -Shingles: too young -Pneumonia: too young  -HPV:  Diet: Fair diet. Twice a day. Skip breakfast. Coffee and water Exercise: No regular exercise. Physical job  Eye exam: PRN Dental exam:  needs updating    Colonoscopy: too young Dexa: NA PSA: Too young  Lung Cancer Screening: NA  Sleep: gets up around 6. Bed time vaires but 9-10. Feels rested. Not a snore  Joint pains: states that when he was in his 61s. States mostly wrist and knees. States that it is not everyday. No autoimmue diseases. No tick      Review of Systems  Constitutional:  Negative for chills, fever and malaise/fatigue.  Respiratory:  Negative for cough and shortness of breath (intermittent when he is stressed out or large meal).   Cardiovascular:  Negative for chest pain and leg swelling.  Gastrointestinal:  Negative for abdominal pain, constipation, nausea and vomiting.       Bm Daily  Genitourinary:  Negative for dysuria and hematuria.       Nocturia intermittently   Neurological:  Negative for dizziness, tingling, weakness and headaches.  Psychiatric/Behavioral:  Negative for hallucinations and suicidal ideas.      Objective:     BP 122/82   Pulse 92   Temp 97.9 F (36.6 C)   Resp 12   Ht 6' (1.829 m)   Wt 196 lb 8 oz (89.1 kg)   SpO2 97%   BMI 26.65 kg/m    Physical Exam Vitals and nursing note reviewed. Exam conducted with a chaperone present [employer], RMA).  Constitutional:      Appearance: Normal appearance.  HENT:     Right  Ear: Tympanic membrane, ear canal and external ear normal.     Left Ear: Tympanic membrane, ear canal and external ear normal.  Cardiovascular:     Rate and Rhythm: Normal rate and regular rhythm.     Pulses: Normal pulses.     Heart sounds: Normal heart sounds.  Pulmonary:     Effort: Pulmonary effort is normal.     Breath sounds: Normal breath sounds.  Abdominal:     General: Bowel sounds are normal. There is no distension.     Palpations: There is no mass.     Tenderness: There is no abdominal tenderness.     Hernia: No hernia is present. There is no hernia in the left inguinal area or right inguinal area.  Genitourinary:    Penis: Normal and uncircumcised.      Testes: Normal.     Epididymis:     Right: Normal.     Left: Normal.  Musculoskeletal:     Right lower leg: No edema.     Left lower leg: No edema.  Lymphadenopathy:     Cervical: No cervical adenopathy.     Lower Body: No right inguinal adenopathy. No left inguinal adenopathy.  Skin:    General: Skin is warm.  Neurological:     General: No focal deficit present.  Mental Status: He is alert.     Deep Tendon Reflexes:     Reflex Scores:      Bicep reflexes are 2+ on the right side and 2+ on the left side.      Patellar reflexes are 2+ on the right side and 2+ on the left side.    Comments: Bilateral upper and lower extremity strength 5/5  Psychiatric:        Mood and Affect: Mood normal.        Behavior: Behavior normal.        Thought Content: Thought content normal.        Judgment: Judgment normal.     No results found for any visits on 04/23/22.    The ASCVD Risk score (Arnett DK, et al., 2019) failed to calculate for the following reasons:   The 2019 ASCVD risk score is only valid for ages 40 to 79    Assessment & Plan:   Problem List Items Addressed This Visit       Nervous and Auditory   Impacted cerumen of right ear    Verbal consent obtained from patient.  Patient was prepped using  cerumen softening eardrop.  Per office policy a mixture of hydroperoxide and water was used to irrigate ear.  Patient tolerated procedure well       Relevant Orders   Ear Lavage     Other   Arthralgia    Patient complains of multiple joint pains inclusive of wrist and bilateral knees.  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  Patient also been taking magnesium as of late and is helped and has not had any trouble in the past couple weeks.  Continue to monitor we will check ANA and ESR.       Relevant Orders   ANA   Sedimentation rate   Preventative health care - Primary    Discussed age-appropriate immunizations and screening exams with patient.  Pending lab work       Relevant Orders   CBC with Differential/Platelet   Comprehensive metabolic panel   TSH   Overweight    Patient continue lifestyle modifications as is.       Relevant Orders   Lipid panel   TSH   Hemoglobin A1c    Return in about 1 year (around 04/24/2023) for cpe with labs.    Matt Cable, NP  

## 2022-04-23 NOTE — Assessment & Plan Note (Signed)
Discussed age-appropriate immunizations and screening exams with patient.  Pending lab work

## 2022-04-23 NOTE — Assessment & Plan Note (Signed)
Patient continue lifestyle modifications as is.

## 2022-04-23 NOTE — Assessment & Plan Note (Signed)
Verbal consent obtained from patient.  Patient was prepped using cerumen softening eardrop.  Per office policy a mixture of hydroperoxide and water was used to irrigate ear.  Patient tolerated procedure well

## 2022-04-23 NOTE — Assessment & Plan Note (Signed)
Patient complains of multiple joint pains inclusive of wrist and bilateral knees.  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  Patient also been taking magnesium as of late and is helped and has not had any trouble in the past couple weeks.  Continue to monitor we will check ANA and ESR.

## 2022-04-24 LAB — COMPREHENSIVE METABOLIC PANEL
ALT: 23 U/L (ref 0–53)
AST: 16 U/L (ref 0–37)
Albumin: 4.9 g/dL (ref 3.5–5.2)
Alkaline Phosphatase: 80 U/L (ref 39–117)
BUN: 21 mg/dL (ref 6–23)
CO2: 25 mEq/L (ref 19–32)
Calcium: 9.6 mg/dL (ref 8.4–10.5)
Chloride: 101 mEq/L (ref 96–112)
Creatinine, Ser: 1.11 mg/dL (ref 0.40–1.50)
GFR: 87.25 mL/min (ref >60.00)
Glucose, Bld: 97 mg/dL (ref 70–99)
Potassium: 4 mEq/L (ref 3.5–5.1)
Sodium: 137 mEq/L (ref 135–145)
Total Bilirubin: 0.7 mg/dL (ref 0.2–1.2)
Total Protein: 7.5 g/dL (ref 6.0–8.3)

## 2022-04-24 LAB — LIPID PANEL
Cholesterol: 191 mg/dL (ref 0–200)
HDL: 47.8 mg/dL (ref >39.00)
LDL Cholesterol: 114 mg/dL — ABNORMAL HIGH (ref 0–99)
NonHDL: 142.82
Total CHOL/HDL Ratio: 4
Triglycerides: 144 mg/dL (ref 0.0–149.0)
VLDL: 28.8 mg/dL (ref 0.0–40.0)

## 2022-04-24 LAB — CBC WITH DIFFERENTIAL/PLATELET
Basophils Absolute: 0 10*3/uL (ref 0.0–0.1)
Basophils Relative: 0.5 % (ref 0.0–3.0)
Eosinophils Absolute: 0.1 10*3/uL (ref 0.0–0.7)
Eosinophils Relative: 1.6 % (ref 0.0–5.0)
HCT: 40.4 % (ref 39.0–52.0)
Hemoglobin: 14.1 g/dL (ref 13.0–17.0)
Lymphocytes Relative: 33.7 % (ref 12.0–46.0)
Lymphs Abs: 2.5 10*3/uL (ref 0.7–4.0)
MCHC: 35 g/dL (ref 30.0–36.0)
MCV: 87.6 fl (ref 78.0–100.0)
Monocytes Absolute: 0.6 10*3/uL (ref 0.1–1.0)
Monocytes Relative: 8.6 % (ref 3.0–12.0)
Neutro Abs: 4.2 10*3/uL (ref 1.4–7.7)
Neutrophils Relative %: 55.6 % (ref 43.0–77.0)
Platelets: 250 10*3/uL (ref 150.0–400.0)
RBC: 4.61 Mil/uL (ref 4.22–5.81)
RDW: 12.3 % (ref 11.5–15.5)
WBC: 7.5 10*3/uL (ref 4.0–10.5)

## 2022-04-24 LAB — SEDIMENTATION RATE: Sed Rate: 4 mm/hr (ref 0–15)

## 2022-04-24 LAB — TSH: TSH: 2.48 u[IU]/mL (ref 0.35–5.50)

## 2022-04-24 LAB — HEMOGLOBIN A1C: Hgb A1c MFr Bld: 5.3 % (ref 4.6–6.5)

## 2022-04-26 LAB — ANA: Anti Nuclear Antibody (ANA): NEGATIVE

## 2022-06-18 IMAGING — CT CT ANGIO CHEST
2 of 6 series · 19 of 36 positions shown · IV contrast (omnipaque)
Comparison: None.

CLINICAL DATA: Chest pain and shortness of breath COVID

EXAM:
CT ANGIOGRAPHY CHEST WITH CONTRAST
TECHNIQUE: Multidetector CT imaging of the chest was performed using the
standard protocol during bolus administration of intravenous
contrast. Multiplanar CT image reconstructions and MIPs were
obtained to evaluate the vascular anatomy.
CONTRAST:  75mL OMNIPAQUE IOHEXOL 350 MG/ML SOLN

[Series 7: pe thins · axial · 0.64mm/px · z∈[+1037,+1296]mm · 18 of 412 slices shown]
[im 21/412  lung]
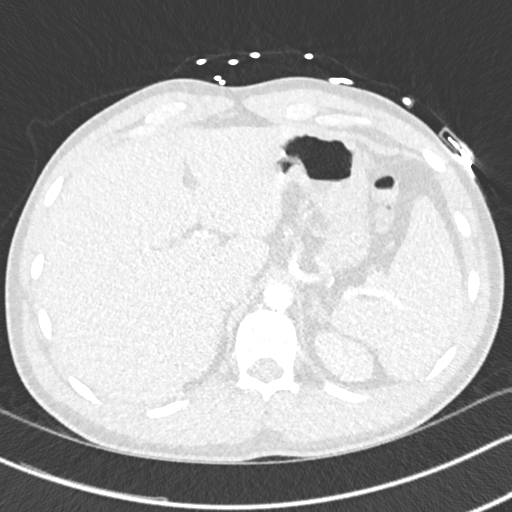
[im 42/412  mediastinal]
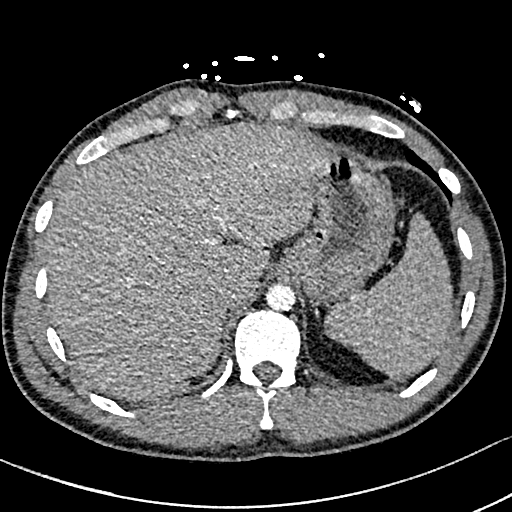
[im 62/412  lung]
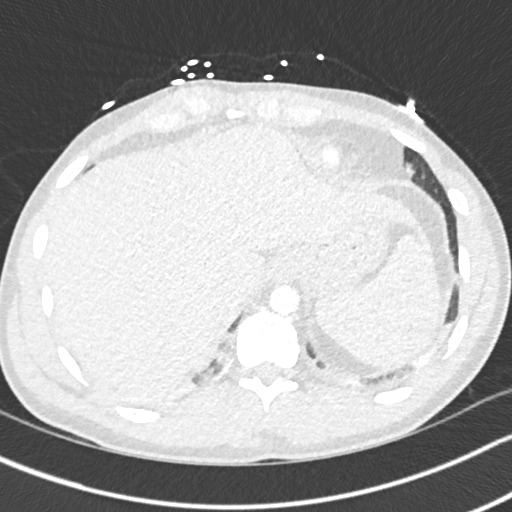
[im 83/412  mediastinal]
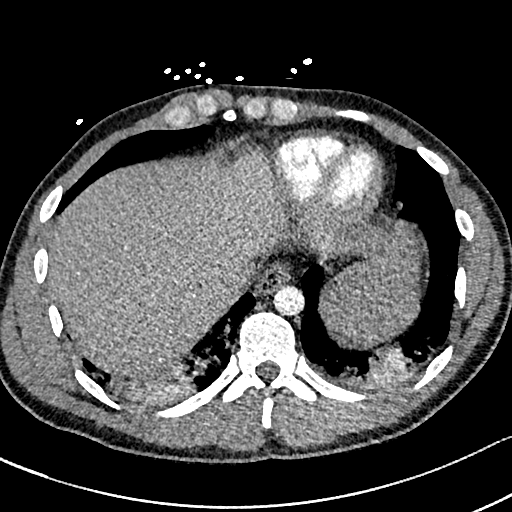
[im 103/412  lung]
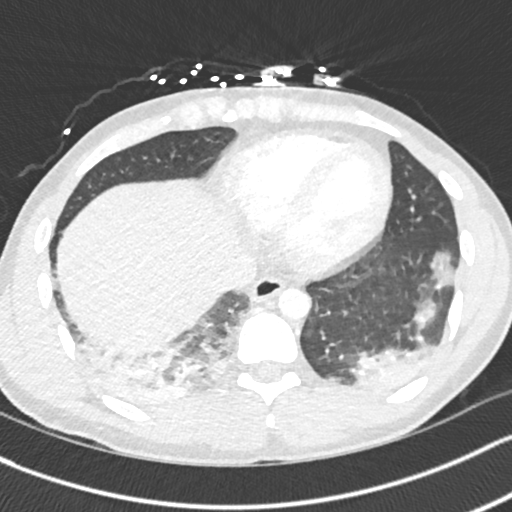
[im 124/412  mediastinal]
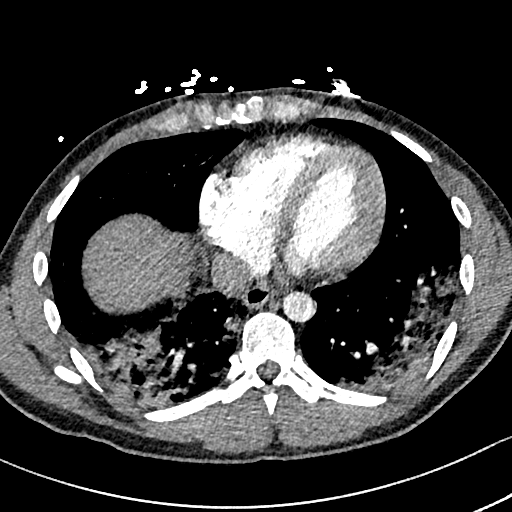
[im 144/412  lung]
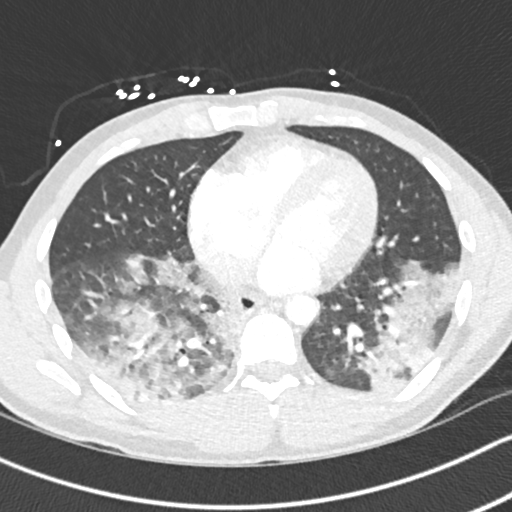
[im 165/412  mediastinal]
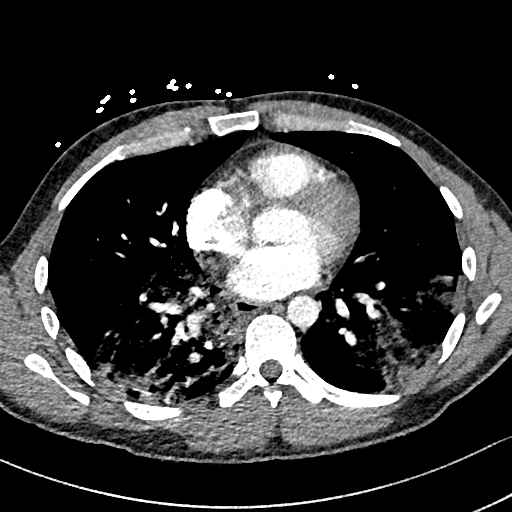
[im 185/412  lung]
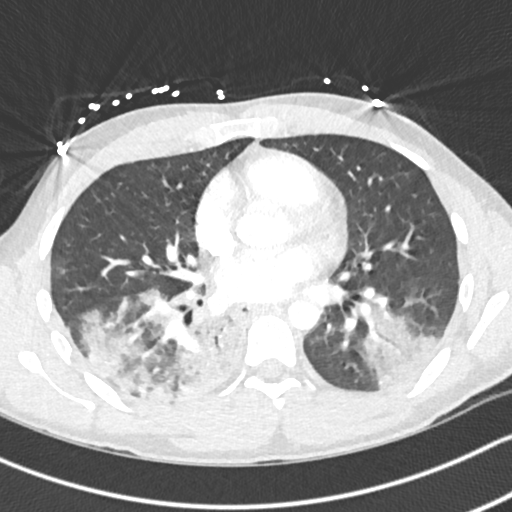
[im 227/412  mediastinal]
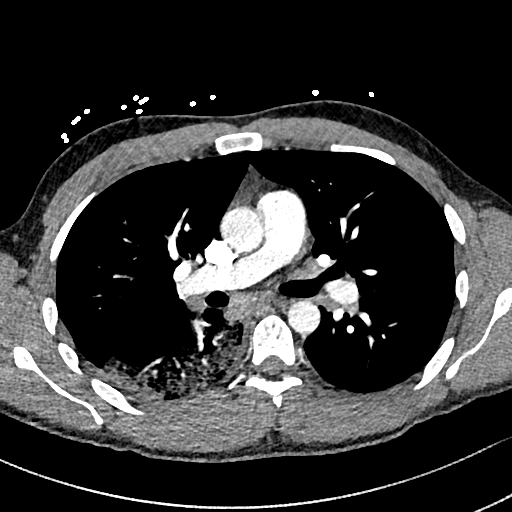
[im 247/412  lung]
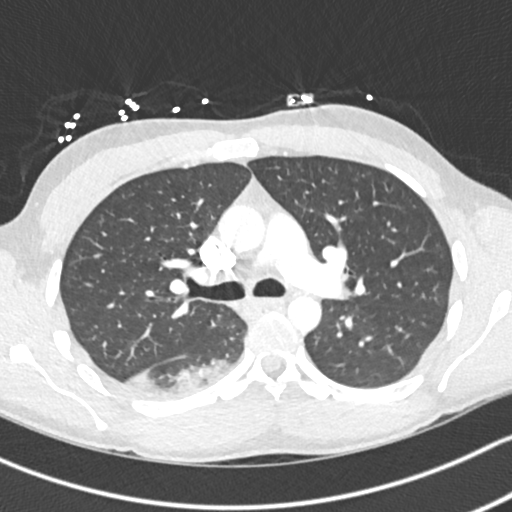
[im 268/412  mediastinal]
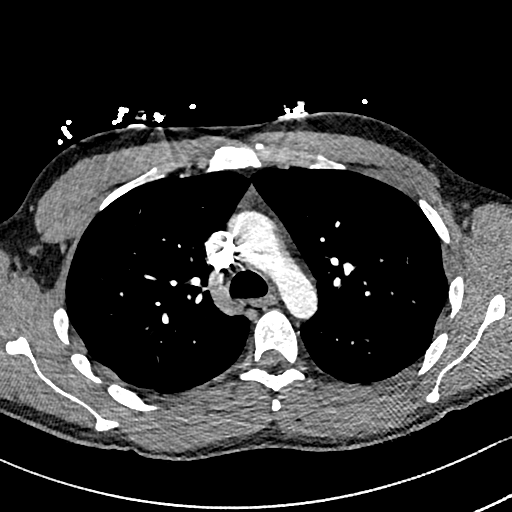
[im 288/412  lung]
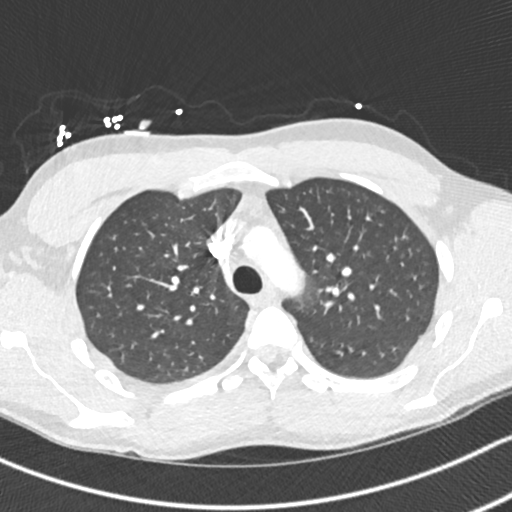
[im 309/412  mediastinal]
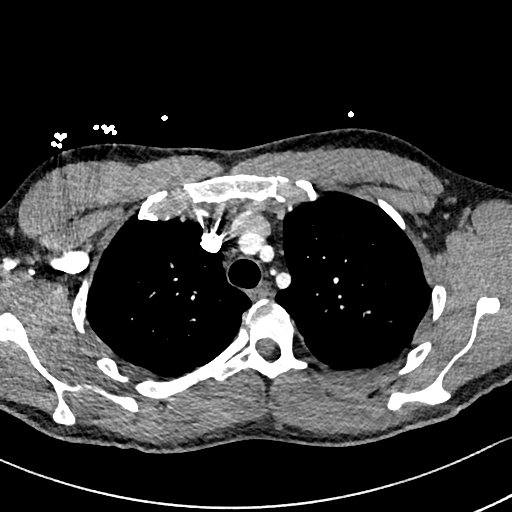
[im 329/412  lung]
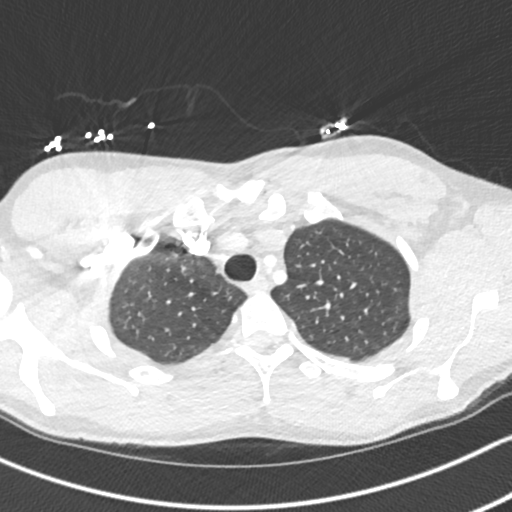
[im 350/412  mediastinal]
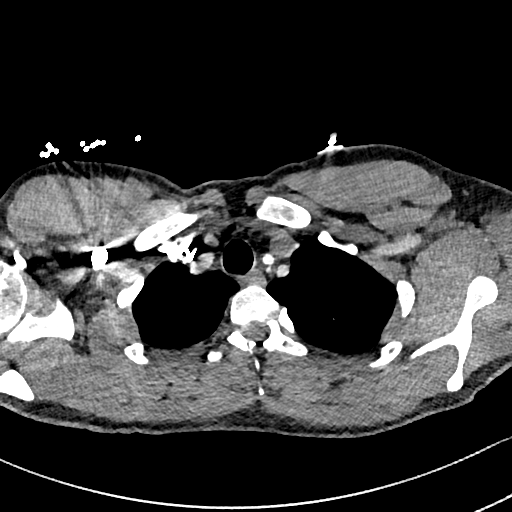
[im 370/412  lung]
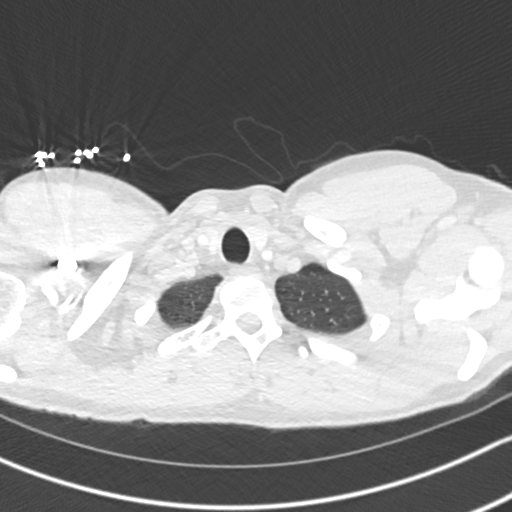
[im 391/412  mediastinal]
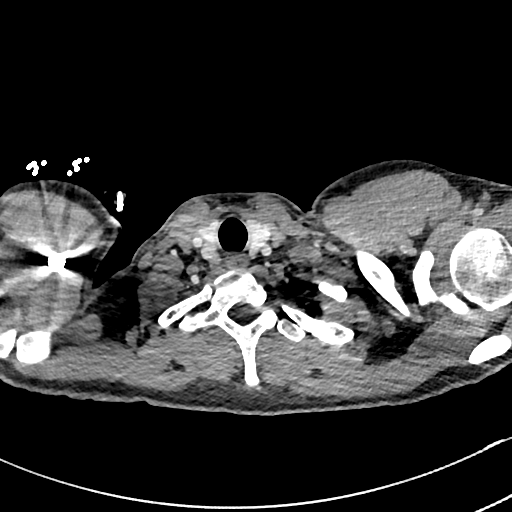

[Series 8: pe 2mm cor · coronal · 0.59mm/px · 1 of 121 slices shown]
[im 61/121  mediastinal]
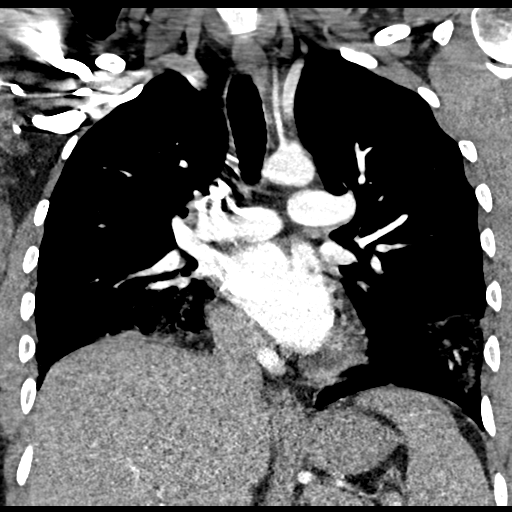

[19 of 36 positions shown; findings below may reference images not displayed]

FINDINGS: Cardiovascular: There is slightly suboptimal opacification of the
main pulmonary artery. No central or segmental pulmonary embolism is
seen. Limited evaluation of the subsegmental arterial branches. The
heart is normal in size. No pericardial effusion or thickening. No
evidence right heart strain. There is normal three-vessel
brachiocephalic anatomy without proximal stenosis. The thoracic
aorta is normal in appearance.

Mediastinum/Nodes: No hilar, mediastinal, or axillary adenopathy.
Thyroid gland, trachea, and esophagus demonstrate no significant
findings.

Lungs/Pleura: Patchy rounded areas consolidation are seen at the
posterior bilateral lung bases. There air bronchogram seen at the
right lung base. No effusion is seen.

Upper Abdomen: No acute abnormalities present in the visualized
portions of the upper abdomen.

Musculoskeletal: No chest wall abnormality. No acute or significant
osseous findings.

Review of the MIP images confirms the above findings.
IMPRESSION: Slightly suboptimal opacification of the main pulmonary artery, no
central or segmental pulmonary embolism.

Multifocal airspace opacities in both lower lungs, consistent with
multifocal pneumonia.

## 2022-12-23 ENCOUNTER — Ambulatory Visit (INDEPENDENT_AMBULATORY_CARE_PROVIDER_SITE_OTHER): Payer: BC Managed Care – PPO

## 2022-12-23 ENCOUNTER — Ambulatory Visit: Payer: BC Managed Care – PPO | Admitting: Nurse Practitioner

## 2022-12-23 DIAGNOSIS — Z23 Encounter for immunization: Secondary | ICD-10-CM | POA: Diagnosis not present

## 2022-12-23 NOTE — Progress Notes (Signed)
Patient presented for TD vaccine given by Sherrilee Gilles, CMA to right deltoid, patient voiced no concerns nor showed any signs of distress during injection.

## 2024-11-01 ENCOUNTER — Encounter: Admitting: Nurse Practitioner

## 2024-12-13 ENCOUNTER — Encounter: Payer: Self-pay | Admitting: Nurse Practitioner

## 2024-12-13 ENCOUNTER — Ambulatory Visit: Admitting: Nurse Practitioner

## 2024-12-13 VITALS — BP 138/82 | HR 82 | Temp 98.2°F | Ht 72.0 in | Wt 204.2 lb

## 2024-12-13 DIAGNOSIS — E663 Overweight: Secondary | ICD-10-CM | POA: Diagnosis not present

## 2024-12-13 DIAGNOSIS — Z126 Encounter for screening for malignant neoplasm of bladder: Secondary | ICD-10-CM

## 2024-12-13 DIAGNOSIS — Z0001 Encounter for general adult medical examination with abnormal findings: Secondary | ICD-10-CM

## 2024-12-13 DIAGNOSIS — R030 Elevated blood-pressure reading, without diagnosis of hypertension: Secondary | ICD-10-CM

## 2024-12-13 DIAGNOSIS — Z1159 Encounter for screening for other viral diseases: Secondary | ICD-10-CM | POA: Diagnosis not present

## 2024-12-13 DIAGNOSIS — E7800 Pure hypercholesterolemia, unspecified: Secondary | ICD-10-CM | POA: Diagnosis not present

## 2024-12-13 DIAGNOSIS — Z131 Encounter for screening for diabetes mellitus: Secondary | ICD-10-CM | POA: Diagnosis not present

## 2024-12-13 DIAGNOSIS — Z0000 Encounter for general adult medical examination without abnormal findings: Secondary | ICD-10-CM

## 2024-12-13 LAB — CBC WITH DIFFERENTIAL/PLATELET
Basophils Absolute: 0 K/uL (ref 0.0–0.1)
Basophils Relative: 0.4 % (ref 0.0–3.0)
Eosinophils Absolute: 0.1 K/uL (ref 0.0–0.7)
Eosinophils Relative: 2 % (ref 0.0–5.0)
HCT: 40.7 % (ref 39.0–52.0)
Hemoglobin: 13.9 g/dL (ref 13.0–17.0)
Lymphocytes Relative: 37.9 % (ref 12.0–46.0)
Lymphs Abs: 1.9 K/uL (ref 0.7–4.0)
MCHC: 34.3 g/dL (ref 30.0–36.0)
MCV: 89.5 fl (ref 78.0–100.0)
Monocytes Absolute: 0.5 K/uL (ref 0.1–1.0)
Monocytes Relative: 9.6 % (ref 3.0–12.0)
Neutro Abs: 2.6 K/uL (ref 1.4–7.7)
Neutrophils Relative %: 50.1 % (ref 43.0–77.0)
Platelets: 274 K/uL (ref 150.0–400.0)
RBC: 4.55 Mil/uL (ref 4.22–5.81)
RDW: 12.4 % (ref 11.5–15.5)
WBC: 5.1 K/uL (ref 4.0–10.5)

## 2024-12-13 LAB — URINALYSIS, MICROSCOPIC ONLY: RBC / HPF: NONE SEEN

## 2024-12-13 LAB — COMPREHENSIVE METABOLIC PANEL WITH GFR
ALT: 27 U/L (ref 3–53)
AST: 18 U/L (ref 5–37)
Albumin: 4.7 g/dL (ref 3.5–5.2)
Alkaline Phosphatase: 68 U/L (ref 39–117)
BUN: 18 mg/dL (ref 6–23)
CO2: 28 meq/L (ref 19–32)
Calcium: 9.5 mg/dL (ref 8.4–10.5)
Chloride: 103 meq/L (ref 96–112)
Creatinine, Ser: 1 mg/dL (ref 0.40–1.50)
GFR: 97.08 mL/min
Glucose, Bld: 98 mg/dL (ref 70–99)
Potassium: 4.5 meq/L (ref 3.5–5.1)
Sodium: 138 meq/L (ref 135–145)
Total Bilirubin: 0.8 mg/dL (ref 0.2–1.2)
Total Protein: 7.7 g/dL (ref 6.0–8.3)

## 2024-12-13 LAB — HEMOGLOBIN A1C: Hgb A1c MFr Bld: 5.4 % (ref 4.6–6.5)

## 2024-12-13 LAB — LIPID PANEL
Cholesterol: 206 mg/dL — ABNORMAL HIGH (ref 28–200)
HDL: 53.5 mg/dL
LDL Cholesterol: 138 mg/dL — ABNORMAL HIGH (ref 10–99)
NonHDL: 152.8
Total CHOL/HDL Ratio: 4
Triglycerides: 75 mg/dL (ref 10.0–149.0)
VLDL: 15 mg/dL (ref 0.0–40.0)

## 2024-12-13 LAB — TSH: TSH: 2.72 u[IU]/mL (ref 0.35–5.50)

## 2024-12-13 NOTE — Assessment & Plan Note (Signed)
 History of the same pending lipid panel today

## 2024-12-13 NOTE — Patient Instructions (Addendum)
 Nice to see you today  I will be in touch with the labs once I have them Follow up with me in 1 year, sooner if you need me   Consider getting the pneumonia vaccine (prevnar 20)

## 2024-12-13 NOTE — Assessment & Plan Note (Signed)
 Discussed age-appropriate immunizations and screening exams.  Did review patient's personal, surgical, social, family histories.  Patient is up-to-date with all age-appropriate vaccinations he would like.  Patient declined flu, pneumonia, HPV vaccines.  Patient is too young for CRC screening.  Patient was given information at discharge about preventative healthcare maintenance with anticipatory guidance.

## 2024-12-13 NOTE — Assessment & Plan Note (Signed)
 Patient had a 4 pound weight gain for a year over the past 2 years.  Did discuss medical recommendation of exercise of 30 minutes a day 5 times a week.  Continue work on healthy lifestyle modifications.

## 2024-12-13 NOTE — Assessment & Plan Note (Signed)
 Elevated blood pressure upon initial check within normal limits upon recheck

## 2024-12-13 NOTE — Progress Notes (Signed)
 "  Established Patient Office Visit  Subjective   Patient ID: Marvin Brady, male    DOB: December 21, 1985  Age: 39 y.o. MRN: [id number]  Chief Complaint  Patient presents with   Annual Exam    HPI  for complete physical and follow up of chronic conditions.  Immunizations: -Tetanus: Completed in 2024 -Influenza:refused  -Shingles: Too young -Pneumonia: discussed in office patient refused -HPV: discussed in office  Diet: Fair diet. He is doing 2 meals and skips breakfast. States that sometimes he over eats at dinner some times. Rarely does snacks. He will do coffee and 2 celisus and then water  Exercise: No regular exercise. Labor with work   Eye exam: PRN Dental exam: Needs updating   Colonoscopy: Too young, currently average risk Lung Cancer Screening: Current smoker  PSA: Too young, currently average risk  Sleep: goes to bed around 10 and will get up around 545. Feels rested. Does snore       Review of Systems  Constitutional:  Negative for chills and fever.  Respiratory:  Negative for shortness of breath.   Cardiovascular:  Negative for chest pain and leg swelling.  Gastrointestinal:  Negative for abdominal pain, blood in stool, constipation, diarrhea, nausea and vomiting.       BM daily   Genitourinary:  Negative for dysuria and hematuria.  Neurological:  Negative for dizziness, tingling and headaches.  Psychiatric/Behavioral:  Negative for hallucinations and suicidal ideas.       Objective:     BP 138/82   Pulse 82   Temp 98.2 F (36.8 C) (Oral)   Ht 6' (1.829 m)   Wt 204 lb 3.2 oz (92.6 kg)   SpO2 96%   BMI 27.69 kg/m  BP Readings from Last 3 Encounters:  12/13/24 138/82  04/23/22 122/82  07/05/20 105/66   Wt Readings from Last 3 Encounters:  12/13/24 204 lb 3.2 oz (92.6 kg)  04/23/22 196 lb 8 oz (89.1 kg)  07/02/20 173 lb 15.1 oz (78.9 kg)   SpO2 Readings from Last 3 Encounters:  12/13/24 96%  04/23/22 97%  07/05/20 98%      Physical  Exam Vitals and nursing note reviewed.  Constitutional:      Appearance: Normal appearance.  HENT:     Right Ear: Tympanic membrane, ear canal and external ear normal.     Left Ear: Tympanic membrane, ear canal and external ear normal.     Mouth/Throat:     Mouth: Mucous membranes are moist.     Pharynx: Oropharynx is clear.  Eyes:     Extraocular Movements: Extraocular movements intact.     Pupils: Pupils are equal, round, and reactive to light.  Cardiovascular:     Rate and Rhythm: Normal rate and regular rhythm.     Pulses: Normal pulses.     Heart sounds:     Gallop present.  Pulmonary:     Effort: Pulmonary effort is normal.     Breath sounds: Normal breath sounds.  Abdominal:     General: Bowel sounds are normal. There is no distension.     Palpations: There is no mass.     Tenderness: There is no abdominal tenderness.     Hernia: No hernia is present.  Genitourinary:    Comments: deferred Musculoskeletal:     Right lower leg: No edema.     Left lower leg: No edema.  Lymphadenopathy:     Cervical: No cervical adenopathy.  Skin:    General: Skin  is warm.  Neurological:     General: No focal deficit present.     Mental Status: He is alert.     Deep Tendon Reflexes:     Reflex Scores:      Bicep reflexes are 2+ on the right side and 2+ on the left side.      Patellar reflexes are 2+ on the right side and 2+ on the left side.    Comments: Bilateral upper and lower extremity strength 5/5  Psychiatric:        Mood and Affect: Mood normal.        Behavior: Behavior normal.        Thought Content: Thought content normal.        Judgment: Judgment normal.      No results found for any visits on 12/13/24.    The ASCVD Risk score (Arnett DK, et al., 2019) failed to calculate for the following reasons:   The 2019 ASCVD risk score is only valid for ages 68 to 58    Assessment & Plan:   Problem List Items Addressed This Visit       Other   Preventative health  care - Primary   Discussed age-appropriate immunizations and screening exams.  Did review patient's personal, surgical, social, family histories.  Patient is up-to-date with all age-appropriate vaccinations he would like.  Patient declined flu, pneumonia, HPV vaccines.  Patient is too young for CRC screening.  Patient was given information at discharge about preventative healthcare maintenance with anticipatory guidance.      Relevant Orders   CBC with Differential/Platelet   Comprehensive metabolic panel with GFR   TSH   Overweight   Patient had a 4 pound weight gain for a year over the past 2 years.  Did discuss medical recommendation of exercise of 30 minutes a day 5 times a week.  Continue work on healthy lifestyle modifications.      Relevant Orders   Hemoglobin A1c   Lipid panel   Elevated blood pressure reading   Elevated blood pressure upon initial check within normal limits upon recheck.      Elevated LDL cholesterol level   History of the same pending lipid panel today      Relevant Orders   Hemoglobin A1c   Lipid panel   Other Visit Diagnoses       Encounter for hepatitis C screening test for low risk patient       Relevant Orders   Hepatitis C antibody     Screening for diabetes mellitus       Relevant Orders   Hemoglobin A1c     Screening for bladder cancer       Relevant Orders   Urine Microscopic       Return in about 1 year (around 12/13/2025) for CPE and Labs.    Adina Crandall, NP  "

## 2024-12-14 LAB — HEPATITIS C ANTIBODY: Hepatitis C Ab: NONREACTIVE

## 2024-12-15 ENCOUNTER — Ambulatory Visit: Payer: Self-pay | Admitting: Nurse Practitioner
